# Patient Record
Sex: Female | Born: 1960 | Race: White | Hispanic: No | Marital: Married | Smoking: Never smoker
Health system: Southern US, Community
[De-identification: ages and names within clinical notes are randomized; demographics above are authoritative.]

## PROBLEM LIST (undated history)

## (undated) DIAGNOSIS — J329 Chronic sinusitis, unspecified: Secondary | ICD-10-CM

## (undated) DIAGNOSIS — T7840XA Allergy, unspecified, initial encounter: Secondary | ICD-10-CM

## (undated) DIAGNOSIS — N816 Rectocele: Secondary | ICD-10-CM

## (undated) DIAGNOSIS — K5900 Constipation, unspecified: Secondary | ICD-10-CM

## (undated) DIAGNOSIS — R32 Unspecified urinary incontinence: Secondary | ICD-10-CM

## (undated) DIAGNOSIS — K449 Diaphragmatic hernia without obstruction or gangrene: Secondary | ICD-10-CM

## (undated) DIAGNOSIS — K219 Gastro-esophageal reflux disease without esophagitis: Secondary | ICD-10-CM

## (undated) DIAGNOSIS — E669 Obesity, unspecified: Secondary | ICD-10-CM

## (undated) DIAGNOSIS — R42 Dizziness and giddiness: Principal | ICD-10-CM

## (undated) DIAGNOSIS — N3281 Overactive bladder: Secondary | ICD-10-CM

## (undated) DIAGNOSIS — J45909 Unspecified asthma, uncomplicated: Secondary | ICD-10-CM

## (undated) HISTORY — DX: Constipation, unspecified: K59.00

## (undated) HISTORY — PX: ABDOMINAL HYSTERECTOMY: SHX81

## (undated) HISTORY — DX: Chronic sinusitis, unspecified: J32.9

## (undated) HISTORY — DX: Unspecified asthma, uncomplicated: J45.909

## (undated) HISTORY — DX: Rectocele: N81.6

## (undated) HISTORY — DX: Unspecified urinary incontinence: R32

## (undated) HISTORY — DX: Allergy, unspecified, initial encounter: T78.40XA

## (undated) HISTORY — PX: KNEE SURGERY: SHX244

## (undated) HISTORY — DX: Diaphragmatic hernia without obstruction or gangrene: K44.9

## (undated) HISTORY — DX: Dizziness and giddiness: R42

## (undated) HISTORY — DX: Obesity, unspecified: E66.9

## (undated) HISTORY — DX: Overactive bladder: N32.81

## (undated) HISTORY — DX: Gastro-esophageal reflux disease without esophagitis: K21.9

---

## 2000-09-15 ENCOUNTER — Encounter: Payer: Self-pay | Admitting: Oral Surgery

## 2000-09-15 ENCOUNTER — Ambulatory Visit (HOSPITAL_COMMUNITY): Admission: RE | Admit: 2000-09-15 | Discharge: 2000-09-15 | Payer: Self-pay | Admitting: Oral Surgery

## 2005-06-07 ENCOUNTER — Ambulatory Visit (HOSPITAL_COMMUNITY): Admission: RE | Admit: 2005-06-07 | Discharge: 2005-06-07 | Payer: Self-pay | Admitting: Gastroenterology

## 2008-10-03 ENCOUNTER — Ambulatory Visit (HOSPITAL_BASED_OUTPATIENT_CLINIC_OR_DEPARTMENT_OTHER): Admission: RE | Admit: 2008-10-03 | Discharge: 2008-10-03 | Payer: Self-pay | Admitting: Internal Medicine

## 2008-10-03 ENCOUNTER — Ambulatory Visit: Payer: Self-pay | Admitting: Diagnostic Radiology

## 2009-05-11 ENCOUNTER — Ambulatory Visit: Payer: Self-pay | Admitting: Hematology & Oncology

## 2009-05-28 LAB — CBC WITH DIFFERENTIAL (CANCER CENTER ONLY)
BASO#: 0 10*3/uL (ref 0.0–0.2)
BASO%: 0.4 % (ref 0.0–2.0)
EOS%: 4.7 % (ref 0.0–7.0)
Eosinophils Absolute: 0.3 10*3/uL (ref 0.0–0.5)
HCT: 38.3 % (ref 34.8–46.6)
HGB: 12.6 g/dL (ref 11.6–15.9)
LYMPH#: 2.2 10*3/uL (ref 0.9–3.3)
LYMPH%: 30.9 % (ref 14.0–48.0)
MCH: 27.6 pg (ref 26.0–34.0)
MCHC: 32.8 g/dL (ref 32.0–36.0)
MCV: 84 fL (ref 81–101)
MONO#: 0.2 10*3/uL (ref 0.1–0.9)
MONO%: 3.5 % (ref 0.0–13.0)
NEUT#: 4.2 10*3/uL (ref 1.5–6.5)
NEUT%: 60.5 % (ref 39.6–80.0)
Platelets: 242 10*3/uL (ref 145–400)
RBC: 4.55 10*6/uL (ref 3.70–5.32)
RDW: 14.3 % (ref 10.5–14.6)
WBC: 6.9 10*3/uL (ref 3.9–10.0)

## 2009-05-28 LAB — MORPHOLOGY - CHCC SATELLITE
PLT EST ~~LOC~~: ADEQUATE
Platelet Morphology: NORMAL
RBC Comments: NORMAL

## 2009-10-16 ENCOUNTER — Ambulatory Visit (HOSPITAL_BASED_OUTPATIENT_CLINIC_OR_DEPARTMENT_OTHER): Admission: RE | Admit: 2009-10-16 | Discharge: 2009-10-16 | Payer: Self-pay | Admitting: Internal Medicine

## 2009-10-16 ENCOUNTER — Ambulatory Visit: Payer: Self-pay | Admitting: Diagnostic Radiology

## 2010-10-12 ENCOUNTER — Other Ambulatory Visit: Payer: Self-pay | Admitting: Family Medicine

## 2010-10-12 ENCOUNTER — Other Ambulatory Visit: Payer: Self-pay | Admitting: *Deleted

## 2010-10-12 DIAGNOSIS — Z1231 Encounter for screening mammogram for malignant neoplasm of breast: Secondary | ICD-10-CM

## 2010-10-26 ENCOUNTER — Ambulatory Visit (HOSPITAL_BASED_OUTPATIENT_CLINIC_OR_DEPARTMENT_OTHER)
Admission: RE | Admit: 2010-10-26 | Discharge: 2010-10-26 | Disposition: A | Discharge status: Home / Self Care | Payer: BC Managed Care – PPO | Source: Ambulatory Visit | Attending: Family Medicine | Admitting: Family Medicine

## 2010-10-26 DIAGNOSIS — Z1231 Encounter for screening mammogram for malignant neoplasm of breast: Secondary | ICD-10-CM

## 2012-06-26 ENCOUNTER — Ambulatory Visit: Payer: Self-pay | Admitting: General Practice

## 2012-11-13 ENCOUNTER — Ambulatory Visit: Payer: Self-pay | Admitting: General Practice

## 2013-03-22 ENCOUNTER — Encounter (INDEPENDENT_AMBULATORY_CARE_PROVIDER_SITE_OTHER): Payer: Self-pay

## 2013-03-22 ENCOUNTER — Ambulatory Visit (INDEPENDENT_AMBULATORY_CARE_PROVIDER_SITE_OTHER): Payer: Managed Care, Other (non HMO) | Admitting: Family Medicine

## 2013-03-22 ENCOUNTER — Ambulatory Visit (INDEPENDENT_AMBULATORY_CARE_PROVIDER_SITE_OTHER): Payer: Managed Care, Other (non HMO)

## 2013-03-22 ENCOUNTER — Encounter: Payer: Self-pay | Admitting: Family Medicine

## 2013-03-22 VITALS — BP 134/93 | HR 66 | Temp 97.1°F | Ht 65.0 in | Wt 223.4 lb

## 2013-03-22 DIAGNOSIS — R059 Cough, unspecified: Secondary | ICD-10-CM

## 2013-03-22 DIAGNOSIS — R062 Wheezing: Secondary | ICD-10-CM | POA: Insufficient documentation

## 2013-03-22 DIAGNOSIS — E669 Obesity, unspecified: Secondary | ICD-10-CM

## 2013-03-22 DIAGNOSIS — J45901 Unspecified asthma with (acute) exacerbation: Secondary | ICD-10-CM

## 2013-03-22 DIAGNOSIS — R05 Cough: Secondary | ICD-10-CM

## 2013-03-22 DIAGNOSIS — R1110 Vomiting, unspecified: Secondary | ICD-10-CM | POA: Insufficient documentation

## 2013-03-22 LAB — POCT CBC
Granulocyte percent: 57.1 %G (ref 37–80)
HCT, POC: 39.6 % (ref 37.7–47.9)
Hemoglobin: 12.3 g/dL (ref 12.2–16.2)
Lymph, poc: 2.3 (ref 0.6–3.4)
MCH, POC: 25 pg — AB (ref 27–31.2)
MCHC: 31.2 g/dL — AB (ref 31.8–35.4)
MCV: 80.2 fL (ref 80–97)
MPV: 8.1 fL (ref 0–99.8)
POC Granulocyte: 3.5 (ref 2–6.9)
POC LYMPH PERCENT: 37.8 %L (ref 10–50)
Platelet Count, POC: 248 10*3/uL (ref 142–424)
RBC: 4.9 M/uL (ref 4.04–5.48)
RDW, POC: 16.4 %
WBC: 6.1 10*3/uL (ref 4.6–10.2)

## 2013-03-22 MED ORDER — BUDESONIDE-FORMOTEROL FUMARATE 160-4.5 MCG/ACT IN AERO: 2.0000 | INHALATION_SPRAY | Freq: Two times a day (BID) | RESPIRATORY_TRACT | Status: DC
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
## 2013-03-22 NOTE — Patient Instructions (Addendum)
Dr Woodroe ModeFrancis Kiam Bransfield's Recommendations  For nutrition information, I recommend books:  1).Eat to Live by Dr Monico HoarJoel Fuhrman. 2).Prevent and Reverse Heart Disease by Dr Suzzette Righteraldwell Esselstyn. 3) Dr Katherina RightNeal Barnard's Book:  Program to Reverse Diabetes  Exercise recommendations are:  If unable to walk, then the patient can exercise in a chair 3 times a day. By flapping arms like a bird gently and raising legs outwards to the front.  If ambulatory, the patient can go for walks for 30 minutes 3 times a week. Then increase the intensity and duration as tolerated.  Goal is to try to attain exercise frequency to 5 times a week.  If applicable: Best to perform resistance exercises (machines or weights) 2 days a week and cardio type exercises 3 days per week.   Asthma, Adult Asthma is a recurring condition in which the airways tighten and narrow. Asthma can make it difficult to breathe. It can cause coughing, wheezing, and shortness of breath. Asthma episodes (also called asthma attacks) range from minor to life-threatening. Asthma cannot be cured, but medicines and lifestyle changes can help control it. CAUSES Asthma is believed to be caused by inherited (genetic) and environmental factors, but its exact cause is unknown. Asthma may be triggered by allergens, lung infections, or irritants in the air. Asthma triggers are different for each person. Common triggers include:   Animal dander.  Dust mites.  Cockroaches.  Pollen from trees or grass.  Mold.  Smoke.  Air pollutants such as dust, household cleaners, hair sprays, aerosol sprays, paint fumes, strong chemicals, or strong odors.  Cold air, weather changes, and winds (which increase molds and pollens in the air).  Strong emotional expressions such as crying or laughing hard.  Stress.  Certain medicines (such as aspirin) or types of drugs (such as beta-blockers).  Sulfites in foods and drinks. Foods and drinks that may contain sulfites  include dried fruit, potato chips, and sparkling grape juice.  Infections or inflammatory conditions such as the flu, a cold, or an inflammation of the nasal membranes (rhinitis).  Gastroesophageal reflux disease (GERD).  Exercise or strenuous activity. SYMPTOMS Symptoms may occur immediately after asthma is triggered or many hours later. Symptoms include:  Wheezing.  Excessive nighttime or early morning coughing.  Frequent or severe coughing with a common cold.  Chest tightness.  Shortness of breath. DIAGNOSIS  The diagnosis of asthma is made by a review of your medical history and a physical exam. Tests may also be performed. These may include:  Lung function studies. These tests show how much air you breath in and out.  Allergy tests.  Imaging tests such as X-rays. TREATMENT  Asthma cannot be cured, but it can usually be controlled. Treatment involves identifying and avoiding your asthma triggers. It also involves medicines. There are 2 classes of medicine used for asthma treatment:   Controller medicines. These prevent asthma symptoms from occurring. They are usually taken every day.  Reliever or rescue medicines. These quickly relieve asthma symptoms. They are used as needed and provide short-term relief. Your health care provider will help you create an asthma action plan. An asthma action plan is a written plan for managing and treating your asthma attacks. It includes a list of your asthma triggers and how they may be avoided. It also includes information on when medicines should be taken and when their dosage should be changed. An action plan may also involve the use of a device called a peak flow meter.  A peak flow meter measures how well the lungs are working. It helps you monitor your condition. HOME CARE INSTRUCTIONS   Take medicine as directed by your health care provider. Speak with your health care provider if you have questions about how or when to take the  medicines.  Use a peak flow meter as directed by your health care provider. Record and keep track of readings.  Understand and use the action plan to help minimize or stop an asthma attack without needing to seek medical care.  Control your home environment in the following ways to help prevent asthma attacks:  Do not smoke. Avoid being exposed to secondhand smoke.  Change your heating and air conditioning filter regularly.  Limit your use of fireplaces and wood stoves.  Get rid of pests (such as roaches and mice) and their droppings.  Throw away plants if you see mold on them.  Clean your floors and dust regularly. Use unscented cleaning products.  Try to have someone else vacuum for you regularly. Stay out of rooms while they are being vacuumed and for a short while afterward. If you vacuum, use a dust mask from a hardware store, a double-layered or microfilter vacuum cleaner bag, or a vacuum cleaner with a HEPA filter.  Replace carpet with wood, tile, or vinyl flooring. Carpet can trap dander and dust.  Use allergy-proof pillows, mattress covers, and box spring covers.  Wash bed sheets and blankets every week in hot water and dry them in a dryer.  Use blankets that are made of polyester or cotton.  Clean bathrooms and kitchens with bleach. If possible, have someone repaint the walls in these rooms with mold-resistant paint. Keep out of the rooms that are being cleaned and painted.  Wash hands frequently. SEEK MEDICAL CARE IF:   You have wheezing, shortness of breath, or a cough even if taking medicine to prevent attacks.  The colored mucus you cough up (sputum) is thicker than usual.  Your sputum changes from clear or white to yellow, green, gray, or bloody.  You have any problems that may be related to the medicines you are taking (such as a rash, itching, swelling, or trouble breathing).  You are using a reliever medicine more than 2 3 times per week.  Your peak flow  is still at 50 79% of you personal best after following your action plan for 1 hour. SEEK IMMEDIATE MEDICAL CARE IF:   You seem to be getting worse and are unresponsive to treatment during an asthma attack.  You are short of breath even at rest.  You get short of breath when doing very little physical activity.  You have difficulty eating, drinking, or talking due to asthma symptoms.  You develop chest pain.  You develop a fast heartbeat.  You have a bluish color to your lips or fingernails.  You are lightheaded, dizzy, or faint.  Your peak flow is less than 50% of your personal best.  You have a fever or persistent symptoms for more than 2 3 days.  You have a fever and symptoms suddenly get worse. MAKE SURE YOU:   Understand these instructions.  Will watch your condition.  Will get help right away if you are not doing well or get worse. Document Released: 02/07/2005 Document Revised: 10/10/2012 Document Reviewed: 09/06/2012 [facility] Patient Information 2014 [place], [place].

## 2013-03-22 NOTE — Progress Notes (Signed)
PT FELT FAINT IN LAB ASSISTANCE GIVEN PER KRISTIN H. VS RECK

## 2013-03-23 LAB — CMP14+EGFR
ALT: 25 IU/L (ref 0–32)
AST: 21 IU/L (ref 0–40)
Albumin/Globulin Ratio: 1.8 (ref 1.1–2.5)
Albumin: 4.6 g/dL (ref 3.5–5.5)
Alkaline Phosphatase: 128 IU/L — ABNORMAL HIGH (ref 39–117)
BUN/Creatinine Ratio: 13 (ref 9–23)
BUN: 10 mg/dL (ref 6–24)
CO2: 23 mmol/L (ref 18–29)
Calcium: 9.5 mg/dL (ref 8.7–10.2)
Chloride: 102 mmol/L (ref 97–108)
Creatinine, Ser: 0.76 mg/dL (ref 0.57–1.00)
GFR calc Af Amer: 105 mL/min/{1.73_m2} (ref >59)
GFR calc non Af Amer: 91 mL/min/{1.73_m2} (ref >59)
Globulin, Total: 2.6 g/dL (ref 1.5–4.5)
Glucose: 101 mg/dL — ABNORMAL HIGH (ref 65–99)
Potassium: 4 mmol/L (ref 3.5–5.2)
Sodium: 141 mmol/L (ref 134–144)
Total Bilirubin: 0.3 mg/dL (ref 0.0–1.2)
Total Protein: 7.2 g/dL (ref 6.0–8.5)

## 2013-03-23 NOTE — Progress Notes (Signed)
Quick Note:  Call Patient Labs that are abnormal: Chest Xray, the Hiatal hernia is larger.  Recommendations: Recommendation is a CT. Will discuss at the follow up visit.   ______

## 2013-03-23 NOTE — Progress Notes (Signed)
Quick Note:  Call patient. Labs normal. No change in plan. ______ 

## 2013-03-25 ENCOUNTER — Telehealth: Payer: Self-pay | Admitting: Family Medicine

## 2013-03-25 NOTE — Telephone Encounter (Signed)
Pt aware of test results

## 2013-04-09 ENCOUNTER — Ambulatory Visit: Payer: Managed Care, Other (non HMO) | Admitting: Family Medicine

## 2013-04-19 ENCOUNTER — Ambulatory Visit (INDEPENDENT_AMBULATORY_CARE_PROVIDER_SITE_OTHER): Payer: Managed Care, Other (non HMO) | Admitting: Family Medicine

## 2013-04-19 ENCOUNTER — Encounter: Payer: Self-pay | Admitting: Family Medicine

## 2013-04-19 VITALS — BP 125/88 | HR 96 | Temp 97.9°F | Ht 65.0 in | Wt 229.6 lb

## 2013-04-19 DIAGNOSIS — J45909 Unspecified asthma, uncomplicated: Secondary | ICD-10-CM | POA: Insufficient documentation

## 2013-04-19 DIAGNOSIS — R059 Cough, unspecified: Secondary | ICD-10-CM

## 2013-04-19 DIAGNOSIS — K449 Diaphragmatic hernia without obstruction or gangrene: Secondary | ICD-10-CM | POA: Insufficient documentation

## 2013-04-19 DIAGNOSIS — E669 Obesity, unspecified: Secondary | ICD-10-CM

## 2013-04-19 DIAGNOSIS — K219 Gastro-esophageal reflux disease without esophagitis: Secondary | ICD-10-CM

## 2013-04-19 DIAGNOSIS — R05 Cough: Secondary | ICD-10-CM

## 2013-04-19 MED ORDER — PANTOPRAZOLE SODIUM 40 MG PO TBEC: 40.0000 mg | DELAYED_RELEASE_TABLET | Freq: Every day | ORAL | Status: DC

## 2013-04-19 NOTE — Progress Notes (Signed)
Patient ID: Christina [name], female   DOB: November 11, 1960, 53 y.o.   MRN: [id number] SUBJECTIVE: CC: Chief Complaint  Patient presents with  . Follow-up    reck cough states still has cough and notices it more at meals     HPI:  As above. Has choking spells with foods. used to see Dr Janan Halter- GI in [place]. Breathing is a little better. Using the inhaler. She was supposed to be on a PPI but went OTC, has had a HH for years. Not doing GERD precautions.   Past Medical History  Diagnosis Date  . Hiatal hernia   . Asthma   . GERD (gastroesophageal reflux disease)    No past surgical history on file. History   Social History  . Marital Status: Married    Spouse Name: N/A    Number of Children: N/A  . Years of Education: N/A   Occupational History  . Not on file.   Social History Main Topics  . Smoking status: Never Smoker   . Smokeless tobacco: Not on file  . Alcohol Use: Not on file  . Drug Use: Not on file  . Sexual Activity: Not on file   Other Topics Concern  . Not on file   Social History Narrative  . No narrative on file   No family history on file. Current Outpatient Prescriptions on File Prior to Visit  Medication Sig Dispense Refill  . budesonide-formoterol (SYMBICORT) 160-4.5 MCG/ACT inhaler Inhale 2 puffs into the lungs 2 (two) times daily.  1 Inhaler  3   No current facility-administered medications on file prior to visit.   Allergies  Allergen Reactions  . Ivp Dye [Iodinated Diagnostic Agents]     STOP BREATHING  . Shellfish Allergy     There is no immunization history on file for this patient. Prior to Admission medications   Medication Sig Start Date End Date Taking? Authorizing Provider  budesonide-formoterol (SYMBICORT) 160-4.5 MCG/ACT inhaler Inhale 2 puffs into the lungs 2 (two) times daily. 03/22/13  Yes Vernie Shanks, MD     ROS: As above in the HPI. All other systems are stable or negative.  OBJECTIVE: APPEARANCE:  Patient in no acute  distress.The patient appeared well nourished and normally developed. Acyanotic. Waist: VITAL SIGNS:BP 125/88  Pulse 96  Temp(Src) 97.9 F (36.6 C) (Oral)  Ht 5' 5"  (1.651 m)  Wt 229 lb 9.6 oz (104.146 kg)  BMI 38.21 kg/m2  SpO2 97% OBese WF  SKIN: warm and  Dry without overt rashes, tattoos and scars  HEAD and Neck: without JVD, Head and scalp: normal Eyes:No scleral icterus. Fundi normal, eye movements normal. Ears: Auricle normal, canal normal, Tympanic membranes normal, insufflation normal. Nose: normal Throat: normal Neck & thyroid: normal  CHEST & LUNGS: Chest wall: normal Lungs: Clear  CVS: Reveals the PMI to be normally located. Regular rhythm, First and Second Heart sounds are normal,  absence of murmurs, rubs or gallops. Peripheral vasculature: Radial pulses: normal Dorsal pedis pulses: normal Posterior pulses: normal  ABDOMEN:  Appearance: Obese Benign, no organomegaly, no masses, no Abdominal Aortic enlargement. No Guarding , no rebound. No Bruits. Bowel sounds: normal  RECTAL: N/A GU: N/A  EXTREMETIES: nonedematous.  MUSCULOSKELETAL:  Spine: normal Joints: intact  NEUROLOGIC: oriented to time,place and person; nonfocal. Strength is normal Sensory is normal Reflexes are normal Cranial Nerves are normal.  Results for orders placed in visit on 03/22/13  CMP14+EGFR      Result Value Ref Range   Glucose  101 (*) 65 - 99 mg/dL   BUN 10  6 - 24 mg/dL   Creatinine, Ser 0.76  0.57 - 1.00 mg/dL   GFR calc non Af Amer 91  >59 mL/min/1.73   GFR calc Af Amer 105  >59 mL/min/1.73   BUN/Creatinine Ratio 13  9 - 23   Sodium 141  134 - 144 mmol/L   Potassium 4.0  3.5 - 5.2 mmol/L   Chloride 102  97 - 108 mmol/L   CO2 23  18 - 29 mmol/L   Calcium 9.5  8.7 - 10.2 mg/dL   Total Protein 7.2  6.0 - 8.5 g/dL   Albumin 4.6  3.5 - 5.5 g/dL   Globulin, Total 2.6  1.5 - 4.5 g/dL   Albumin/Globulin Ratio 1.8  1.1 - 2.5   Total Bilirubin 0.3  0.0 - 1.2 mg/dL    Alkaline Phosphatase 128 (*) 39 - 117 IU/L   AST 21  0 - 40 IU/L   ALT 25  0 - 32 IU/L  POCT CBC      Result Value Ref Range   WBC 6.1  4.6 - 10.2 K/uL   Lymph, poc 2.3  0.6 - 3.4   POC LYMPH PERCENT 37.8  10 - 50 %L   POC Granulocyte 3.5  2 - 6.9   Granulocyte percent 57.1  37 - 80 %G   RBC 4.9  4.04 - 5.48 M/uL   Hemoglobin 12.3  12.2 - 16.2 g/dL   HCT, POC 39.6  37.7 - 47.9 %   MCV 80.2  80 - 97 fL   MCH, POC 25.0 (*) 27 - 31.2 pg   MCHC 31.2 (*) 31.8 - 35.4 g/dL   RDW, POC 16.4     Platelet Count, POC 248.0  142 - 424 K/uL   MPV 8.1  0 - 99.8 fL   PFTs: normal Scanned in the records.  ASSESSMENT:  Asthma  Cough  Hiatal hernia - Plan: pantoprazole (PROTONIX) 40 MG tablet  Obesity  GERD (gastroesophageal reflux disease) - Plan: pantoprazole (PROTONIX) 40 MG tablet Larger Hiatal hernia shadow on the Chest Xray.   PLAN: Handout on GERD. Discussed GERD precautions.       Dr Paula Libra Recommendations  For nutrition information, I recommend books:  1).Eat to Live by Dr Excell Seltzer. 2).Prevent and Reverse Heart Disease by Dr Karl Luke. 3) Dr Janene Harvey Book:  Program to Reverse Diabetes  Exercise recommendations are:  If unable to walk, then the patient can exercise in a chair 3 times a day. By flapping arms like a bird gently and raising legs outwards to the front.  If ambulatory, the patient can go for walks for 30 minutes 3 times a week. Then increase the intensity and duration as tolerated.  Goal is to try to attain exercise frequency to 5 times a week.  If applicable: Best to perform resistance exercises (machines or weights) 2 days a week and cardio type exercises 3 days per week.   Patient to see Dr Janan Halter. Call if she needs a forma referral.  Discussed with her the Xray finding of possible enlarging Hiatal hernia, and the need for further evaluation.  No orders of the defined types were placed in this encounter.   Meds ordered this  encounter  Medications  . pantoprazole (PROTONIX) 40 MG tablet    Sig: Take 1 tablet (40 mg total) by mouth daily.    Dispense:  30 tablet    Refill:  3   There are no discontinued medications. Return in about 4 weeks (around 05/17/2013) for Recheck medical problems.  Ragan Duhon P. Jacelyn Grip, M.D.

## 2013-04-19 NOTE — Patient Instructions (Addendum)
Gastroesophageal Reflux Disease, Adult Gastroesophageal reflux disease (GERD) happens when acid from your stomach flows up into the esophagus. When acid comes in contact with the esophagus, the acid causes soreness (inflammation) in the esophagus. Over time, GERD may create small holes (ulcers) in the lining of the esophagus. CAUSES   Increased body weight. This puts pressure on the stomach, making acid rise from the stomach into the esophagus.  Smoking. This increases acid production in the stomach.  Drinking alcohol. This causes decreased pressure in the lower esophageal sphincter (valve or ring of muscle between the esophagus and stomach), allowing acid from the stomach into the esophagus.  Late evening meals and a full stomach. This increases pressure and acid production in the stomach.  A malformed lower esophageal sphincter. Sometimes, no cause is found. SYMPTOMS   Burning pain in the lower part of the mid-chest behind the breastbone and in the mid-stomach area. This may occur twice a week or more often.  Trouble swallowing.  Sore throat.  Dry cough.  Asthma-like symptoms including chest tightness, shortness of breath, or wheezing. DIAGNOSIS  Your caregiver may be able to diagnose GERD based on your symptoms. In some cases, X-rays and other tests may be done to check for complications or to check the condition of your stomach and esophagus. TREATMENT  Your caregiver may recommend over-the-counter or prescription medicines to help decrease acid production. Ask your caregiver before starting or adding any new medicines.  HOME CARE INSTRUCTIONS   Change the factors that you can control. Ask your caregiver for guidance concerning weight loss, quitting smoking, and alcohol consumption.  Avoid foods and drinks that make your symptoms worse, such as:  Caffeine or alcoholic drinks.  Chocolate.  Peppermint or mint flavorings.  Garlic and onions.  Spicy foods.  Citrus fruits,  such as oranges, lemons, or limes.  Tomato-based foods such as sauce, chili, salsa, and pizza.  Fried and fatty foods.  Avoid lying down for the 3 hours prior to your bedtime or prior to taking a nap.  Eat small, frequent meals instead of large meals.  Wear loose-fitting clothing. Do not wear anything tight around your waist that causes pressure on your stomach.  Raise the head of your bed 6 to 8 inches with wood blocks to help you sleep. Extra pillows will not help.  Only take over-the-counter or prescription medicines for pain, discomfort, or fever as directed by your caregiver.  Do not take aspirin, ibuprofen, or other nonsteroidal anti-inflammatory drugs (NSAIDs). SEEK IMMEDIATE MEDICAL CARE IF:   You have pain in your arms, neck, jaw, teeth, or back.  Your pain increases or changes in intensity or duration.  You develop nausea, vomiting, or sweating (diaphoresis).  You develop shortness of breath, or you faint.  Your vomit is green, yellow, black, or looks like coffee grounds or blood.  Your stool is red, bloody, or black. These symptoms could be signs of other problems, such as heart disease, gastric bleeding, or esophageal bleeding. MAKE SURE YOU:   Understand these instructions.  Will watch your condition.  Will get help right away if you are not doing well or get worse. Document Released: 11/17/2004 Document Revised: 05/02/2011 Document Reviewed: 08/27/2010 ExitCare Patient Information 2014 [employer].        Dr Kylena Mole's Recommendations  For nutrition information, I recommend books:  1).Eat to Live by Dr Joel Fuhrman. 2).Prevent and Reverse Heart Disease by Dr Caldwell Esselstyn. 3) Dr Neal Barnard's Book:  Program to Reverse Diabetes    Exercise recommendations are:  If unable to walk, then the patient can exercise in a chair 3 times a day. By flapping arms like a bird gently and raising legs outwards to the front.  If ambulatory, the patient  can go for walks for 30 minutes 3 times a week. Then increase the intensity and duration as tolerated.  Goal is to try to attain exercise frequency to 5 times a week.  If applicable: Best to perform resistance exercises (machines or weights) 2 days a week and cardio type exercises 3 days per week.   Hiatal Hernia A hiatal hernia occurs when a part of the stomach slides above the diaphragm. The diaphragm is the thin muscle separating the belly (abdomen) from the chest. A hiatal hernia can be something you are born with or develop over time. Hiatal hernias may allow stomach acid to flow back into your esophagus, the tube which carries food from your mouth to your stomach. If this acid causes problems it is called GERD (gastro-esophageal reflux disease).  SYMPTOMS  Common symptoms of GERD are heartburn (burning in your chest). This is worse when lying down or bending over. It may also cause belching and indigestion. Some of the things which make GERD worse are:  Increased weight pushes on stomach making acid rise more easily.  Smoking markedly increases acid production.  Alcohol decreases lower esophageal sphincter pressure (valve between stomach and esophagus), allowing acid from stomach into esophagus.  Late evening meals and going to bed with a full stomach increases pressure.  Anything that causes an increase in acid production.  Lower esophageal sphincter incompetence. DIAGNOSIS  Hiatal hernia is often diagnosed with x-rays of your stomach and small bowel. This is called an UGI (upper gastrointestinal x-ray). Sometimes a gastroscopic procedure is done. This is a procedure where your caregiver uses a flexible instrument to look into the stomach and small bowel. HOME CARE INSTRUCTIONS   Try to achieve and maintain an ideal body weight.  Avoid drinking alcoholic beverages.  Stop smoking.  Put the head of your bed on 4 to 6 inch blocks. This will keep your head and esophagus higher  than your stomach. If you cannot use blocks, sleep with several pillows under your head and shoulders.  Over-the-counter medications will decrease acid production. Your caregiver can also prescribe medications for this. Take as directed.  1/2 to 1 teaspoon of an antacid taken every hour while awake, with meals and at bedtime, will neutralize acid.  Do not take aspirin, ibuprofen (Advil or Motrin), or other nonsteroidal anti-inflammatory drugs.  Do not wear tight clothing around your chest or stomach.  Eat smaller meals and eat more frequently. This keeps your stomach from getting too full. Eat slowly.  Do not lie down for 2 or 3 hours after eating. Do not eat or drink anything 1 to 2 hours before going to bed.  Avoid caffeine beverages (colas, coffee, cocoa, tea), fatty foods, citrus fruits and all other foods and drinks that contain acid and that seem to increase the problems.  Avoid bending over, especially after eating. Also avoid straining during bowel movements or when urinating or lifting things. Anything that increases the pressure in your belly increases the amount of acid that may be pushed up into your esophagus. SEEK IMMEDIATE MEDICAL CARE IF:  There is change in location (pain in arms, neck, jaw, teeth or back) of your pain, or the pain is getting worse.  You also experience nausea, vomiting, sweating (diaphoresis), or shortness  of breath.  You develop continual vomiting, vomit blood or coffee ground material, have bright red blood in your stools, or have black tarry stools. Some of these symptoms could signal other problems such as heart disease. MAKE SURE YOU:   Understand these instructions.  Monitor your condition.  Contact your caregiver if you are not doing well or are getting worse. Document Released: 04/30/2003 Document Revised: 05/02/2011 Document Reviewed: 02/07/2005 [facility] Patient Information 2014 [place], [place].

## 2013-05-20 ENCOUNTER — Ambulatory Visit (INDEPENDENT_AMBULATORY_CARE_PROVIDER_SITE_OTHER): Payer: Managed Care, Other (non HMO) | Admitting: Family Medicine

## 2013-05-20 ENCOUNTER — Encounter: Payer: Self-pay | Admitting: Family Medicine

## 2013-05-20 ENCOUNTER — Ambulatory Visit (INDEPENDENT_AMBULATORY_CARE_PROVIDER_SITE_OTHER): Payer: Managed Care, Other (non HMO)

## 2013-05-20 VITALS — BP 119/74 | HR 73 | Temp 97.8°F | Ht 65.0 in | Wt 233.8 lb

## 2013-05-20 DIAGNOSIS — K219 Gastro-esophageal reflux disease without esophagitis: Secondary | ICD-10-CM

## 2013-05-20 DIAGNOSIS — M25539 Pain in unspecified wrist: Secondary | ICD-10-CM

## 2013-05-20 DIAGNOSIS — J45909 Unspecified asthma, uncomplicated: Secondary | ICD-10-CM

## 2013-05-20 DIAGNOSIS — R059 Cough, unspecified: Secondary | ICD-10-CM

## 2013-05-20 DIAGNOSIS — M25531 Pain in right wrist: Secondary | ICD-10-CM

## 2013-05-20 DIAGNOSIS — R05 Cough: Secondary | ICD-10-CM

## 2013-05-20 DIAGNOSIS — E669 Obesity, unspecified: Secondary | ICD-10-CM

## 2013-05-20 DIAGNOSIS — K449 Diaphragmatic hernia without obstruction or gangrene: Secondary | ICD-10-CM

## 2013-05-20 MED ORDER — DICLOFENAC SODIUM 1 % TD GEL: 2.0000 g | Freq: Four times a day (QID) | TRANSDERMAL | Status: AC

## 2013-05-20 NOTE — Progress Notes (Signed)
Patient ID: Christina [name], female   DOB: [birth year], 53 y.o.   MRN: [id number] SUBJECTIVE: CC: Chief Complaint  Patient presents with  . Follow-up    reck cough needs referral to Dr Janan Halter states good days and bad     HPI: Here for follow up of [place] and GERD and Asthma. Cough comes and goes. Has been in [facility] in [place]. Beds are flat. Having symptoms then.  Breakfast: 1/2 cup of oatmeal. Lunch: 3 oz of chicken and slaw. Supper : ate at [place]  Right latter wrist tender and sore to use for the past 1  1/2 months. No fever, no swelling.  Past Medical History  Diagnosis Date  . Hiatal hernia   . Asthma   . GERD (gastroesophageal reflux disease)    No past surgical history on file. History   Social History  . Marital Status: Married    Spouse Name: N/A    Number of Children: N/A  . Years of Education: N/A   Occupational History  . Not on file.   Social History Main Topics  . Smoking status: Never Smoker   . Smokeless tobacco: Not on file  . Alcohol Use: Not on file  . Drug Use: Not on file  . Sexual Activity: Not on file   Other Topics Concern  . Not on file   Social History Narrative  . No narrative on file   No family history on file. Current Outpatient Prescriptions on File Prior to Visit  Medication Sig Dispense Refill  . budesonide-formoterol (SYMBICORT) 160-4.5 MCG/ACT inhaler Inhale 2 puffs into the lungs 2 (two) times daily.  1 Inhaler  3  . pantoprazole (PROTONIX) 40 MG tablet Take 1 tablet (40 mg total) by mouth daily.  30 tablet  3   No current facility-administered medications on file prior to visit.   Allergies  Allergen Reactions  . Ivp Dye [Iodinated Diagnostic Agents]     STOP BREATHING  . Shellfish Allergy     There is no immunization history on file for this patient. Prior to Admission medications   Medication Sig Start Date End Date Taking? Authorizing Provider  budesonide-formoterol (SYMBICORT) 160-4.5 MCG/ACT inhaler Inhale 2  puffs into the lungs 2 (two) times daily. 03/22/13  Yes Vernie Shanks, MD  pantoprazole (PROTONIX) 40 MG tablet Take 1 tablet (40 mg total) by mouth daily. 04/19/13  Yes Vernie Shanks, MD     ROS: As above in the HPI. All other systems are stable or negative.  OBJECTIVE: APPEARANCE:  Patient in no acute distress.The patient appeared well nourished and normally developed. Acyanotic. Waist: VITAL SIGNS:BP 119/74  Pulse 73  Temp(Src) 97.8 F (36.6 C) (Oral)  Ht 5' 5"  (1.651 m)  Wt 233 lb 12.8 oz (106.051 kg)  BMI 38.91 kg/m2  SpO2 96%  Obese WF Weight gain.   SKIN: warm and  Dry without overt rashes, tattoos and scars  HEAD and Neck: without JVD, Head and scalp: normal Eyes:No scleral icterus. Fundi normal, eye movements normal. Ears: Auricle normal, canal normal, Tympanic membranes normal, insufflation normal. Nose: normal Throat: normal Neck & thyroid: normal  CHEST & LUNGS: Chest wall: normal Lungs: Clear  CVS: Reveals the PMI to be normally located. Regular rhythm, First and Second Heart sounds are normal,  absence of murmurs, rubs or gallops. Peripheral vasculature: Radial pulses: normal Dorsal pedis pulses: normal Posterior pulses: normal  ABDOMEN:  Appearance: obese Benign, no organomegaly, no masses, no Abdominal Aortic enlargement. No Guarding ,  no rebound. No Bruits. Bowel sounds: normal  RECTAL: N/A GU: N/A  EXTREMETIES: nonedematous.  MUSCULOSKELETAL:  Spine: normal Joints: right wrist lateral wrist margin tender to palpation and stressing the  Ligaments. No swelling. No effucion. No deformity.   NEUROLOGIC: oriented to time,place and person; nonfocal. Cranial Nerves are normal.  Results for orders placed in visit on 03/22/13  CMP14+EGFR      Result Value Ref Range   Glucose 101 (*) 65 - 99 mg/dL   BUN 10  6 - 24 mg/dL   Creatinine, Ser 0.76  0.57 - 1.00 mg/dL   GFR calc non Af Amer 91  >59 mL/min/1.73   GFR calc Af Amer 105  >59  mL/min/1.73   BUN/Creatinine Ratio 13  9 - 23   Sodium 141  134 - 144 mmol/L   Potassium 4.0  3.5 - 5.2 mmol/L   Chloride 102  97 - 108 mmol/L   CO2 23  18 - 29 mmol/L   Calcium 9.5  8.7 - 10.2 mg/dL   Total Protein 7.2  6.0 - 8.5 g/dL   Albumin 4.6  3.5 - 5.5 g/dL   Globulin, Total 2.6  1.5 - 4.5 g/dL   Albumin/Globulin Ratio 1.8  1.1 - 2.5   Total Bilirubin 0.3  0.0 - 1.2 mg/dL   Alkaline Phosphatase 128 (*) 39 - 117 IU/L   AST 21  0 - 40 IU/L   ALT 25  0 - 32 IU/L  POCT CBC      Result Value Ref Range   WBC 6.1  4.6 - 10.2 K/uL   Lymph, poc 2.3  0.6 - 3.4   POC LYMPH PERCENT 37.8  10 - 50 %L   POC Granulocyte 3.5  2 - 6.9   Granulocyte percent 57.1  37 - 80 %G   RBC 4.9  4.04 - 5.48 M/uL   Hemoglobin 12.3  12.2 - 16.2 g/dL   HCT, POC 39.6  37.7 - 47.9 %   MCV 80.2  80 - 97 fL   MCH, POC 25.0 (*) 27 - 31.2 pg   MCHC 31.2 (*) 31.8 - 35.4 g/dL   RDW, POC 16.4     Platelet Count, POC 248.0  142 - 424 K/uL   MPV 8.1  0 - 99.8 fL    ASSESSMENT:  Obesity  Hiatal hernia - Plan: Ambulatory referral to Gastroenterology  Cough  Asthma  GERD (gastroesophageal reflux disease) - Plan: Ambulatory referral to Gastroenterology  Wrist pain, right - Plan: DG Wrist Complete Right, diclofenac sodium (VOLTAREN) 1 % GEL Has an enlarging Hiatal hernia. And I suspect that her symptoms are due to reflux as a consequence of the [facility].  PLAN:  Orders Placed This Encounter  Procedures  . DG Wrist Complete Right    Standing Status: Future     Number of Occurrences: 1     Standing Expiration Date: 07/21/2014    Order Specific Question:  Reason for Exam (SYMPTOM  OR DIAGNOSIS REQUIRED)    Answer:  right wrist pain    Order Specific Question:  Is the patient pregnant?    Answer:  No    Order Specific Question:  Preferred imaging location?    Answer:  Internal  . Ambulatory referral to Gastroenterology    Referral Priority:  Routine    Referral Type:  Consultation    Referral Reason:   Specialty Services Required    Requested Specialty:  Gastroenterology    Number of Visits Requested:  1  [facility] reading (PRIMARY) by  Dr.Jazman Reuter: no fracture no acute abnormality seen.                               Meds ordered this encounter  Medications  . diclofenac sodium (VOLTAREN) 1 % GEL    Sig: Apply 2 g topically 4 (four) times daily.    Dispense:  100 g    Refill:  1  will treat with strengthening exercises  To the wrist , ice, and topical voltaren.  There are no discontinued medications. Return in about 3 months (around 08/20/2013) for Recheck medical problems.  Pleasant Bensinger P. Jacelyn Grip, M.D.

## 2013-05-20 NOTE — Patient Instructions (Addendum)
Gastroesophageal Reflux Disease, Adult Gastroesophageal reflux disease (GERD) happens when acid from your stomach flows up into the esophagus. When acid comes in contact with the esophagus, the acid causes soreness (inflammation) in the esophagus. Over time, GERD may create small holes (ulcers) in the lining of the esophagus. CAUSES   Increased body weight. This puts pressure on the stomach, making acid rise from the stomach into the esophagus.  Smoking. This increases acid production in the stomach.  Drinking alcohol. This causes decreased pressure in the lower esophageal sphincter (valve or ring of muscle between the esophagus and stomach), allowing acid from the stomach into the esophagus.  Late evening meals and a full stomach. This increases pressure and acid production in the stomach.  A malformed lower esophageal sphincter. Sometimes, no cause is found. SYMPTOMS   Burning pain in the lower part of the mid-chest behind the breastbone and in the mid-stomach area. This may occur twice a week or more often.  Trouble swallowing.  Sore throat.  Dry cough.  Asthma-like symptoms including chest tightness, shortness of breath, or wheezing. DIAGNOSIS  Your caregiver may be able to diagnose GERD based on your symptoms. In some cases, X-rays and other tests may be done to check for complications or to check the condition of your stomach and esophagus. TREATMENT  Your caregiver may recommend over-the-counter or prescription medicines to help decrease acid production. Ask your caregiver before starting or adding any new medicines.  HOME CARE INSTRUCTIONS   Change the factors that you can control. Ask your caregiver for guidance concerning weight loss, quitting smoking, and alcohol consumption.  Avoid foods and drinks that make your symptoms worse, such as:  Caffeine or alcoholic drinks.  Chocolate.  Peppermint or mint flavorings.  Garlic and onions.  Spicy foods.  Citrus fruits,  such as oranges, lemons, or limes.  Tomato-based foods such as sauce, chili, salsa, and pizza.  Fried and fatty foods.  Avoid lying down for the 3 hours prior to your bedtime or prior to taking a nap.  Eat small, frequent meals instead of large meals.  Wear loose-fitting clothing. Do not wear anything tight around your waist that causes pressure on your stomach.  Raise the head of your bed 6 to 8 inches with wood blocks to help you sleep. Extra pillows will not help.  Only take over-the-counter or prescription medicines for pain, discomfort, or fever as directed by your caregiver.  Do not take aspirin, ibuprofen, or other nonsteroidal anti-inflammatory drugs (NSAIDs). SEEK IMMEDIATE MEDICAL CARE IF:   You have pain in your arms, neck, jaw, teeth, or back.  Your pain increases or changes in intensity or duration.  You develop nausea, vomiting, or sweating (diaphoresis).  You develop shortness of breath, or you faint.  Your vomit is green, yellow, black, or looks like coffee grounds or blood.  Your stool is red, bloody, or black. These symptoms could be signs of other problems, such as heart disease, gastric bleeding, or esophageal bleeding. MAKE SURE YOU:   Understand these instructions.  Will watch your condition.  Will get help right away if you are not doing well or get worse. Document Released: 11/17/2004 Document Revised: 05/02/2011 Document Reviewed: 08/27/2010 ExitCare Patient Information 2014 [employer].        Dr Lacresia Darwish's Recommendations  For nutrition information, I recommend books:  1).Eat to Live by Dr Joel Fuhrman. 2).Prevent and Reverse Heart Disease by Dr Caldwell Esselstyn. 3) Dr Neal Barnard's Book:  Program to Reverse Diabetes    Exercise recommendations are:  If unable to walk, then the patient can exercise in a chair 3 times a day. By flapping arms like a bird gently and raising legs outwards to the front.  If ambulatory, the patient  can go for walks for 30 minutes 3 times a week. Then increase the intensity and duration as tolerated.  Goal is to try to attain exercise frequency to 5 times a week.  If applicable: Best to perform resistance exercises (machines or weights) 2 days a week and cardio type exercises 3 days per week.  

## 2013-05-22 ENCOUNTER — Other Ambulatory Visit: Payer: Self-pay | Admitting: Family Medicine

## 2013-10-01 ENCOUNTER — Other Ambulatory Visit: Payer: Self-pay | Admitting: Family Medicine

## 2013-10-01 DIAGNOSIS — Z1231 Encounter for screening mammogram for malignant neoplasm of breast: Secondary | ICD-10-CM

## 2013-10-07 ENCOUNTER — Ambulatory Visit (HOSPITAL_BASED_OUTPATIENT_CLINIC_OR_DEPARTMENT_OTHER)
Admission: RE | Admit: 2013-10-07 | Discharge: 2013-10-07 | Disposition: A | Discharge status: Home / Self Care | Payer: 59 | Source: Ambulatory Visit | Attending: Family Medicine | Admitting: Family Medicine

## 2013-10-07 DIAGNOSIS — Z1231 Encounter for screening mammogram for malignant neoplasm of breast: Secondary | ICD-10-CM | POA: Diagnosis present

## 2013-10-17 ENCOUNTER — Encounter: Payer: Self-pay | Admitting: Adult Health

## 2013-10-17 ENCOUNTER — Ambulatory Visit (INDEPENDENT_AMBULATORY_CARE_PROVIDER_SITE_OTHER): Payer: 59 | Admitting: Adult Health

## 2013-10-17 VITALS — BP 114/80 | HR 80 | Ht 63.5 in | Wt 224.0 lb

## 2013-10-17 DIAGNOSIS — N3946 Mixed incontinence: Secondary | ICD-10-CM

## 2013-10-17 DIAGNOSIS — N3281 Overactive bladder: Secondary | ICD-10-CM

## 2013-10-17 DIAGNOSIS — R32 Unspecified urinary incontinence: Secondary | ICD-10-CM | POA: Insufficient documentation

## 2013-10-17 DIAGNOSIS — Z01419 Encounter for gynecological examination (general) (routine) without abnormal findings: Secondary | ICD-10-CM

## 2013-10-17 DIAGNOSIS — Z1212 Encounter for screening for malignant neoplasm of rectum: Secondary | ICD-10-CM

## 2013-10-17 DIAGNOSIS — N816 Rectocele: Secondary | ICD-10-CM

## 2013-10-17 DIAGNOSIS — K5900 Constipation, unspecified: Secondary | ICD-10-CM

## 2013-10-17 HISTORY — DX: Unspecified urinary incontinence: R32

## 2013-10-17 HISTORY — DX: Constipation, unspecified: K59.00

## 2013-10-17 HISTORY — DX: Rectocele: N81.6

## 2013-10-17 HISTORY — DX: Overactive bladder: N32.81

## 2013-10-17 LAB — HEMOCCULT GUIAC POC 1CARD (OFFICE): Fecal Occult Blood, POC: NEGATIVE

## 2013-10-17 MED ORDER — MIRABEGRON ER 25 MG PO TB24: 25.0000 mg | ORAL_TABLET | Freq: Every day | ORAL | Status: DC

## 2013-10-17 NOTE — Patient Instructions (Signed)
Urinary Incontinence Urinary incontinence is the involuntary loss of urine from your bladder. CAUSES  There are many causes of urinary incontinence. They include:  Medicines.  Infections.  Prostatic enlargement, leading to overflow of urine from your bladder.  Surgery.  Neurological diseases.  Emotional factors. SIGNS AND SYMPTOMS Urinary Incontinence can be divided into four types: 1. Urge incontinence. Urge incontinence is the involuntary loss of urine before you have the opportunity to go to the bathroom. There is a sudden urge to void but not enough time to reach a bathroom. 2. Stress incontinence. Stress incontinence is the sudden loss of urine with any activity that forces urine to pass. It is commonly caused by anatomical changes to the pelvis and sphincter areas of your body. 3. Overflow incontinence. Overflow incontinence is the loss of urine from an obstructed opening to your bladder. This results in a backup of urine and a resultant buildup of pressure within the bladder. When the pressure within the bladder exceeds the closing pressure of the sphincter, the urine overflows, which causes incontinence, similar to water overflowing a dam. 4. Total incontinence. Total incontinence is the loss of urine as a result of the inability to store urine within your bladder. DIAGNOSIS  Evaluating the cause of incontinence may require:  A thorough and complete medical and obstetric history.  A complete physical exam.  Laboratory tests such as a urine culture and sensitivities. When additional tests are indicated, they can include:  An ultrasound exam.  Kidney and bladder X-rays.  Cystoscopy. This is an exam of the bladder using a narrow scope.  Urodynamic testing to test the nerve function to the bladder and sphincter areas. TREATMENT  Treatment for urinary incontinence depends on the cause:  For urge incontinence caused by a bacterial infection, antibiotics will be prescribed.  If the urge incontinence is related to medicines you take, your health care provider may have you change the medicine.  For stress incontinence, surgery to re-establish anatomical support to the bladder or sphincter, or both, will often correct the condition.  For overflow incontinence caused by an enlarged prostate, an operation to open the channel through the enlarged prostate will allow the flow of urine out of the bladder. In women with fibroids, a hysterectomy may be recommended.  For total incontinence, surgery on your urinary sphincter may help. An artificial urinary sphincter (an inflatable cuff placed around the urethra) may be required. In women who have developed a hole-like passage between their bladder and vagina (vesicovaginal fistula), surgery to close the fistula often is required. HOME CARE INSTRUCTIONS  Normal daily hygiene and the use of pads or adult diapers that are changed regularly will help prevent odors and skin damage.  Avoid caffeine. It can overstimulate your bladder.  Use the bathroom regularly. Try about every 2-3 hours to go to the bathroom, even if you do not feel the need to do so. Take time to empty your bladder completely. After urinating, wait a minute. Then try to urinate again.  For causes involving nerve dysfunction, keep a log of the medicines you take and a journal of the times you go to the bathroom. SEEK MEDICAL CARE IF:  You experience worsening of pain instead of improvement in pain after your procedure.  Your incontinence becomes worse instead of better. SEE IMMEDIATE MEDICAL CARE IF:  You experience fever or shaking chills.  You are unable to pass your urine.  You have redness spreading into your groin or down into your thighs. MAKE SURE   YOU:   Understand these instructions.   Will watch your condition.  Will get help right away if you are not doing well or get worse. Document Released: 03/17/2004 Document Revised: 11/28/2012 Document  Reviewed: 07/17/2012 [facility] Patient Information 2015 [place], [place]. This information is not intended to replace advice given to you by your health care provider. Make sure you discuss any questions you have with your health care provider. Physical in 1 year Mammogram yearly Labs with PCP Call me 1/2 real oatmeal 1/2 cup apple sauce 1/2 cup prune juice daily

## 2013-10-17 NOTE — Progress Notes (Signed)
Patient ID: Aniaya Bacha, female   DOB: 08/04/1960, 53 y.o.   MRN: [id number] History of Present Illness: Grover is a 52 year old white female, married in for a physical.She is new to this practice.She works Holiday representative.  Current Medications, Allergies, Past Medical History, Past Surgical History, Family History and Social History were reviewed in [employer] record.     Review of Systems: Patient denies any headaches, blurred vision, shortness of breath, chest pain, abdominal pain, problems with  intercourse. No joint pain or mood swings.She does have constipation, may go once a week and pees often and has mixed urinary incontinence. She also has trouble swallowing at times.   Physical Exam:BP 114/80  Pulse 80  Ht 5' 3.5" (1.613 m)  Wt 224 lb (101.606 kg)  BMI 39.05 kg/m2 General:  Well developed, well nourished, no acute distress Skin:  Warm and dry Neck:  Midline trachea, normal thyroid Lungs; Clear to auscultation bilaterally Breast:  No dominant palpable mass, retraction, or nipple discharge Cardiovascular: Regular rate and rhythm Abdomen:  Soft, non tender, no hepatosplenomegaly, obes Pelvic:  External genitalia is normal in appearance.  The vagina has decrease color, moisture and rugae.The cervix and uterus are absent.  No    adnexal masses, has some global tenderness noted. Rectal: Good sphincter tone, no polyps, or hemorrhoids felt.  Hemoccult negative.+rectocele Extremities:  No swelling or varicosities noted Psych:  No mood changes, alert and cooperative,seems happy   Impression: Yearly gyn exam no pap Rectocele OAB Mixed UI Constipation    Plan: Rx myrbetriq 25 mg 1 daily # 30 with 6 refills Physical in 1 year Mammogram yearly just had one colonoscopy 2011 Review handout on UI

## 2013-11-11 ENCOUNTER — Ambulatory Visit (INDEPENDENT_AMBULATORY_CARE_PROVIDER_SITE_OTHER): Payer: 59 | Admitting: Adult Health

## 2013-11-11 ENCOUNTER — Encounter: Payer: Self-pay | Admitting: Adult Health

## 2013-11-11 VITALS — BP 120/60 | Ht 63.5 in | Wt 222.0 lb

## 2013-11-11 DIAGNOSIS — J018 Other acute sinusitis: Secondary | ICD-10-CM

## 2013-11-11 DIAGNOSIS — N3946 Mixed incontinence: Secondary | ICD-10-CM

## 2013-11-11 DIAGNOSIS — R42 Dizziness and giddiness: Secondary | ICD-10-CM | POA: Insufficient documentation

## 2013-11-11 DIAGNOSIS — J329 Chronic sinusitis, unspecified: Secondary | ICD-10-CM

## 2013-11-11 HISTORY — DX: Dizziness and giddiness: R42

## 2013-11-11 HISTORY — DX: Chronic sinusitis, unspecified: J32.9

## 2013-11-11 MED ORDER — OXYBUTYNIN CHLORIDE ER 10 MG PO TB24: 10.0000 mg | ORAL_TABLET | Freq: Every day | ORAL | Status: DC

## 2013-11-11 MED ORDER — MECLIZINE HCL 25 MG PO TABS: 25.0000 mg | ORAL_TABLET | Freq: Three times a day (TID) | ORAL | Status: DC | PRN

## 2013-11-11 MED ORDER — AZITHROMYCIN 250 MG PO TABS: ORAL_TABLET | ORAL | Status: DC

## 2013-11-11 NOTE — Patient Instructions (Signed)
Urinary Incontinence Urinary incontinence is the involuntary loss of urine from your bladder. CAUSES  There are many causes of urinary incontinence. They include:  Medicines.  Infections.  Prostatic enlargement, leading to overflow of urine from your bladder.  Surgery.  Neurological diseases.  Emotional factors. SIGNS AND SYMPTOMS Urinary Incontinence can be divided into four types: 1. Urge incontinence. Urge incontinence is the involuntary loss of urine before you have the opportunity to go to the bathroom. There is a sudden urge to void but not enough time to reach a bathroom. 2. Stress incontinence. Stress incontinence is the sudden loss of urine with any activity that forces urine to pass. It is commonly caused by anatomical changes to the pelvis and sphincter areas of your body. 3. Overflow incontinence. Overflow incontinence is the loss of urine from an obstructed opening to your bladder. This results in a backup of urine and a resultant buildup of pressure within the bladder. When the pressure within the bladder exceeds the closing pressure of the sphincter, the urine overflows, which causes incontinence, similar to water overflowing a dam. 4. Total incontinence. Total incontinence is the loss of urine as a result of the inability to store urine within your bladder. DIAGNOSIS  Evaluating the cause of incontinence may require:  A thorough and complete medical and obstetric history.  A complete physical exam.  Laboratory tests such as a urine culture and sensitivities. When additional tests are indicated, they can include:  An ultrasound exam.  Kidney and bladder X-rays.  Cystoscopy. This is an exam of the bladder using a narrow scope.  Urodynamic testing to test the nerve function to the bladder and sphincter areas. TREATMENT  Treatment for urinary incontinence depends on the cause:  For urge incontinence caused by a bacterial infection, antibiotics will be prescribed.  If the urge incontinence is related to medicines you take, your health care provider may have you change the medicine.  For stress incontinence, surgery to re-establish anatomical support to the bladder or sphincter, or both, will often correct the condition.  For overflow incontinence caused by an enlarged prostate, an operation to open the channel through the enlarged prostate will allow the flow of urine out of the bladder. In women with fibroids, a hysterectomy may be recommended.  For total incontinence, surgery on your urinary sphincter may help. An artificial urinary sphincter (an inflatable cuff placed around the urethra) may be required. In women who have developed a hole-like passage between their bladder and vagina (vesicovaginal fistula), surgery to close the fistula often is required. HOME CARE INSTRUCTIONS  Normal daily hygiene and the use of pads or adult diapers that are changed regularly will help prevent odors and skin damage.  Avoid caffeine. It can overstimulate your bladder.  Use the bathroom regularly. Try about every 2-3 hours to go to the bathroom, even if you do not feel the need to do so. Take time to empty your bladder completely. After urinating, wait a minute. Then try to urinate again.  For causes involving nerve dysfunction, keep a log of the medicines you take and a journal of the times you go to the bathroom. SEEK MEDICAL CARE IF:  You experience worsening of pain instead of improvement in pain after your procedure.  Your incontinence becomes worse instead of better. SEE IMMEDIATE MEDICAL CARE IF:  You experience fever or shaking chills.  You are unable to pass your urine.  You have redness spreading into your groin or down into your thighs. MAKE SURE   YOU:   Understand these instructions.   Will watch your condition.  Will get help right away if you are not doing well or get worse. Document Released: 03/17/2004 Document Revised: 11/28/2012 Document  Reviewed: 07/17/2012 [facility] Patient Information 2015 [place], [place]. This information is not intended to replace advice given to you by your health care provider. Make sure you discuss any questions you have with your health care provider. Vertigo Vertigo means you feel like you or your surroundings are moving when they are not. Vertigo can be dangerous if it occurs when you are at work, driving, or performing difficult activities.  CAUSES  Vertigo occurs when there is a conflict of signals sent to your brain from the visual and sensory systems in your body. There are many different causes of vertigo, including:  Infections, especially in the inner ear.  A bad reaction to a drug or misuse of alcohol and medicines.  Withdrawal from drugs or alcohol.  Rapidly changing positions, such as lying down or rolling over in bed.  A migraine headache.  Decreased blood flow to the brain.  Increased pressure in the brain from a head injury, infection, tumor, or bleeding. SYMPTOMS  You may feel as though the world is spinning around or you are falling to the ground. Because your balance is upset, vertigo can cause nausea and vomiting. You may have involuntary eye movements (nystagmus). DIAGNOSIS  Vertigo is usually diagnosed by physical exam. If the cause of your vertigo is unknown, your caregiver may perform imaging tests, such as an MRI scan (magnetic resonance imaging). TREATMENT  Most cases of vertigo resolve on their own, without treatment. Depending on the cause, your caregiver may prescribe certain medicines. If your vertigo is related to body position issues, your caregiver may recommend movements or procedures to correct the problem. In rare cases, if your vertigo is caused by certain inner ear problems, you may need surgery. HOME CARE INSTRUCTIONS   Follow your caregiver's instructions.  Avoid driving.  Avoid operating heavy machinery.  Avoid performing any tasks that would be  dangerous to you or others during a vertigo episode.  Tell your caregiver if you notice that certain medicines seem to be causing your vertigo. Some of the medicines used to treat vertigo episodes can actually make them worse in some people. SEEK IMMEDIATE MEDICAL CARE IF:   Your medicines do not relieve your vertigo or are making it worse.  You develop problems with talking, walking, weakness, or using your arms, hands, or legs.  You develop severe headaches.  Your nausea or vomiting continues or gets worse.  You develop visual changes.  A family member notices behavioral changes.  Your condition gets worse. MAKE SURE YOU:  Understand these instructions.  Will watch your condition.  Will get help right away if you are not doing well or get worse. Document Released: 11/17/2004 Document Revised: 05/02/2011 Document Reviewed: 08/26/2010 [facility] Patient Information 2015 [place], [place]. This information is not intended to replace advice given to you by your health care provider. Make sure you discuss any questions you have with your health care provider. Sinusitis Sinusitis is redness, soreness, and inflammation of the paranasal sinuses. Paranasal sinuses are air pockets within the bones of your face (beneath the eyes, the middle of the forehead, or above the eyes). In healthy paranasal sinuses, mucus is able to drain out, and air is able to circulate through them by way of your nose. However, when your paranasal sinuses are inflamed, mucus and air  can become trapped. This can allow bacteria and other germs to grow and cause infection. Sinusitis can develop quickly and last only a short time (acute) or continue over a long period (chronic). Sinusitis that lasts for more than 12 weeks is considered chronic.  CAUSES  Causes of sinusitis include:  Allergies.  Structural abnormalities, such as displacement of the cartilage that separates your nostrils (deviated septum), which can  decrease the air flow through your nose and sinuses and affect sinus drainage.  Functional abnormalities, such as when the small hairs (cilia) that line your sinuses and help remove mucus do not work properly or are not present. SIGNS AND SYMPTOMS  Symptoms of acute and chronic sinusitis are the same. The primary symptoms are pain and pressure around the affected sinuses. Other symptoms include:  Upper toothache.  Earache.  Headache.  Bad breath.  Decreased sense of smell and taste.  A cough, which worsens when you are lying flat.  Fatigue.  Fever.  Thick drainage from your nose, which often is green and may contain pus (purulent).  Swelling and warmth over the affected sinuses. DIAGNOSIS  Your health care provider will perform a physical exam. During the exam, your health care provider may:  Look in your nose for signs of abnormal growths in your nostrils (nasal polyps).  Tap over the affected sinus to check for signs of infection.  View the inside of your sinuses (endoscopy) using an imaging device that has a light attached (endoscope). If your health care provider suspects that you have chronic sinusitis, one or more of the following tests may be recommended:  Allergy tests.  Nasal culture. A sample of mucus is taken from your nose, sent to a lab, and screened for bacteria.  Nasal cytology. A sample of mucus is taken from your nose and examined by your health care provider to determine if your sinusitis is related to an allergy. TREATMENT  Most cases of acute sinusitis are related to a viral infection and will resolve on their own within 10 days. Sometimes medicines are prescribed to help relieve symptoms (pain medicine, decongestants, nasal steroid sprays, or saline sprays).  However, for sinusitis related to a bacterial infection, your health care provider will prescribe antibiotic medicines. These are medicines that will help kill the bacteria causing the infection.    Rarely, sinusitis is caused by a fungal infection. In theses cases, your health care provider will prescribe antifungal medicine. For some cases of chronic sinusitis, surgery is needed. Generally, these are cases in which sinusitis recurs more than 3 times per year, despite other treatments. HOME CARE INSTRUCTIONS   Drink plenty of water. Water helps thin the mucus so your sinuses can drain more easily.  Use a humidifier.  Inhale steam 3 to 4 times a day (for example, sit in the bathroom with the shower running).  Apply a warm, moist washcloth to your face 3 to 4 times a day, or as directed by your health care provider.  Use saline nasal sprays to help moisten and clean your sinuses.  Take medicines only as directed by your health care provider.  If you were prescribed either an antibiotic or antifungal medicine, finish it all even if you start to feel better. SEEK IMMEDIATE MEDICAL CARE IF:  You have increasing pain or severe headaches.  You have nausea, vomiting, or drowsiness.  You have swelling around your face.  You have vision problems.  You have a stiff neck.  You have difficulty breathing. MAKE SURE  YOU:   Understand these instructions.  Will watch your condition.  Will get help right away if you are not doing well or get worse. Document Released: 02/07/2005 Document Revised: 06/24/2013 Document Reviewed: 02/22/2011 [facility] Patient Information 2015 [place], [place]. This information is not intended to replace advice given to you by your health care provider. Make sure you discuss any questions you have with your health care provider. Take z pack  Try antivert Try ditropan for UI Call [facility] [phone]

## 2013-11-11 NOTE — Progress Notes (Signed)
Subjective:     Patient ID: Christina [name], female   DOB: December 06, 1960, 53 y.o.   MRN: [id number]  HPI [name] is a 53 year old white female in complaining of allergies and sinus pain and feels sea sick, and teeth hurt and has flutters in right ear. Did not get myrbetriq filled due to cost.Still with mixed UI.Has new dog.  Review of Systems See HPI Reviewed past medical,surgical, social and family history. Reviewed medications and allergies.     Objective:   Physical Exam BP 120/60  Ht 5' 3.5" (1.613 m)  Wt 222 lb (100.699 kg)  BMI 38.70 kg/m2   Skin warm and dry. Neck: mid line trachea, normal thyroid, or lymph nodes felt. Lungs: clear to ausculation bilaterally. Cardiovascular: regular rate and rhythm.Right ear mildly red, no tenderness with palpation but feels dizzy with quick change of position.  Assessment:     Vertigo Sinus infection  Mixed urinary incontinence    Plan:    Rx Z pack Rx Antivert 25 mg #30 no refills Rx ditropan XL 10 mg #30 1 daily with 11 refills Follow up prn  Given number for Dr Jeanelle Malling to recheck allergies Review handout on sinus infection,vertigo and UI

## 2014-10-13 ENCOUNTER — Other Ambulatory Visit: Payer: Self-pay | Admitting: Family Medicine

## 2014-10-13 DIAGNOSIS — Z1231 Encounter for screening mammogram for malignant neoplasm of breast: Secondary | ICD-10-CM

## 2014-12-22 ENCOUNTER — Ambulatory Visit (INDEPENDENT_AMBULATORY_CARE_PROVIDER_SITE_OTHER): Payer: 59

## 2014-12-22 ENCOUNTER — Ambulatory Visit (HOSPITAL_BASED_OUTPATIENT_CLINIC_OR_DEPARTMENT_OTHER)
Admission: RE | Admit: 2014-12-22 | Discharge: 2014-12-22 | Disposition: A | Discharge status: Home / Self Care | Payer: 59 | Source: Ambulatory Visit | Attending: Family Medicine | Admitting: Family Medicine

## 2014-12-22 ENCOUNTER — Ambulatory Visit (INDEPENDENT_AMBULATORY_CARE_PROVIDER_SITE_OTHER): Payer: 59 | Admitting: Podiatry

## 2014-12-22 ENCOUNTER — Encounter: Payer: Self-pay | Admitting: Podiatry

## 2014-12-22 VITALS — BP 150/90 | HR 84 | Resp 16

## 2014-12-22 DIAGNOSIS — Z1231 Encounter for screening mammogram for malignant neoplasm of breast: Secondary | ICD-10-CM | POA: Diagnosis not present

## 2014-12-22 DIAGNOSIS — M722 Plantar fascial fibromatosis: Secondary | ICD-10-CM

## 2014-12-22 MED ORDER — TRIAMCINOLONE ACETONIDE 10 MG/ML IJ SUSP
10.0000 mg | Freq: Once | INTRAMUSCULAR | Status: AC
  Administered 2014-12-22: 10 mg

## 2014-12-22 NOTE — Patient Instructions (Signed)

## 2014-12-22 NOTE — Progress Notes (Signed)
   Subjective:    Patient ID: Christina CaneGracie Christina [name], female    DOB: August 26, 1960, 54 y.o.   MRN: [id number]  HPI Pt presents with bilateral pf pain left over right   Review of Systems  Musculoskeletal: Positive for back pain, arthralgias and gait problem.  Neurological: Positive for light-headedness and headaches.  All other systems reviewed and are negative.      Objective:   Physical Exam        Assessment & Plan:

## 2014-12-24 NOTE — Progress Notes (Signed)
Subjective:     Patient ID: Christina [name], female   DOB: August 07, 1960, 54 y.o.   MRN: [id number]  HPI patient presents stating I have a lot of pain in my heel left over right that's been going on and I have had orthotics which are no longer supporting my arch and the pain is been present for several months left over right   Review of Systems  All other systems reviewed and are negative.      Objective:   Physical Exam  Constitutional: She is oriented to person, place, and time.  Cardiovascular: Intact distal pulses.   Musculoskeletal: Normal range of motion.  Neurological: She is oriented to person, place, and time.  Skin: Skin is warm.  Nursing note and vitals reviewed.  neurovascular status found to be intact muscle strength adequate range of motion within normal limits. Patient's noted to have intense discomfort plantar aspect heel left over right with inflammation and fluid around the medial band with depression of the arch noted bilateral and mild equinus condition noted. Patient has good digital perfusion and is well oriented 3     Assessment:      acute plantar fasciitis left over right with depression of the arch and mechanical dysfunction as part of the problem    Plan:      H&P and x-rays reviewed with patient. Today I injected the plantar fascial left 3 mg Kenalog 5 mill grams Xylocaine dispensed fascial brace and gave instructions for long-term physical therapy. I dispensed and instructed on physical therapy and shoe gear modifications and also scanned for custom orthotic devices

## 2015-01-16 ENCOUNTER — Encounter: Payer: Self-pay | Admitting: Family Medicine

## 2015-01-16 ENCOUNTER — Ambulatory Visit: Payer: 59 | Admitting: Pediatrics

## 2015-01-16 ENCOUNTER — Ambulatory Visit (INDEPENDENT_AMBULATORY_CARE_PROVIDER_SITE_OTHER): Payer: 59 | Admitting: Family Medicine

## 2015-01-16 VITALS — BP 130/81 | HR 81 | Temp 97.0°F | Ht 63.5 in | Wt 229.4 lb

## 2015-01-16 DIAGNOSIS — J3089 Other allergic rhinitis: Secondary | ICD-10-CM

## 2015-01-16 DIAGNOSIS — R300 Dysuria: Secondary | ICD-10-CM

## 2015-01-16 LAB — POCT UA - MICROSCOPIC ONLY
CRYSTALS, UR, HPF, POC: NEGATIVE
Casts, Ur, LPF, POC: NEGATIVE
Yeast, UA: NEGATIVE

## 2015-01-16 LAB — POCT URINALYSIS DIPSTICK
Bilirubin, UA: NEGATIVE
GLUCOSE UA: NEGATIVE
Ketones, UA: NEGATIVE
Leukocytes, UA: NEGATIVE
Nitrite, UA: NEGATIVE
PH UA: 5
PROTEIN UA: NEGATIVE
UROBILINOGEN UA: NEGATIVE

## 2015-01-16 MED ORDER — FLUTICASONE PROPIONATE 50 MCG/ACT NA SUSP: 1.0000 | Freq: Two times a day (BID) | NASAL | Status: DC | PRN

## 2015-01-16 MED ORDER — CETIRIZINE HCL 10 MG PO TABS: 10.0000 mg | ORAL_TABLET | Freq: Every day | ORAL | Status: DC

## 2015-01-16 MED ORDER — MOMETASONE FUROATE 50 MCG/ACT NA SUSP: 2.0000 | Freq: Every day | NASAL | Status: DC

## 2015-01-16 MED ORDER — SULFAMETHOXAZOLE-TRIMETHOPRIM 800-160 MG PO TABS: 1.0000 | ORAL_TABLET | Freq: Two times a day (BID) | ORAL | Status: DC

## 2015-01-16 NOTE — Progress Notes (Signed)
BP 130/81 mmHg  Pulse 81  Temp(Src) 97 F (36.1 C)  Ht 5' 3.5" (1.613 m)  Wt 229 lb 6.4 oz (104.055 kg)  BMI 39.99 kg/m2   Subjective:    Patient ID: Christina [name], female    DOB: 04/19/1960, 54 y.o.   MRN: [id number]  HPI: Christina [name] is a 54 y.o. female presenting on 01/16/2015 for Sinusitis and Urinary Tract Infection   HPI Dysuria Patient presents today because she has a one-week history of burning and pain when she urinates and malodorous urine. She says when she urinates at the urethral opening she experiences a burning sensation and pain. She has had urinary tract infections before and this is similar to what they feel like. She denies any fevers or chills. She denies any abdominal pain. She denies any flank pain.  Sinus pressure Patient is been having sinus pressure and congestion and pressure in her right ear. She also feels like her right ear is popping when she sneezes or blows her nose. She denies any fevers or chills. She does admit to having postnasal drainage and pressure behind her eyes. This is been going on for the past 3 days. She is tempted to use over-the-counter Alka-Seltzer and sinus and cold medication without much success.  Relevant past medical, surgical, family and social history reviewed and updated as indicated. Interim medical history since our last visit reviewed. Allergies and medications reviewed and updated.  Review of Systems  Constitutional: Negative for fever and chills.  HENT: Positive for congestion, postnasal drip, rhinorrhea, sinus pressure, sneezing and sore throat. Negative for ear discharge and ear pain.   Eyes: Negative for pain, redness and visual disturbance.  Respiratory: Positive for cough. Negative for chest tightness and shortness of breath.   Cardiovascular: Negative for chest pain and leg swelling.  Gastrointestinal: Negative for abdominal pain.  Genitourinary: Positive for dysuria, urgency and frequency. Negative for hematuria,  flank pain, decreased urine volume, vaginal bleeding, vaginal discharge, difficulty urinating, vaginal pain and menstrual problem.  Musculoskeletal: Negative for back pain and gait problem.  Skin: Negative for rash.  Neurological: Negative for light-headedness and headaches.  Psychiatric/Behavioral: Negative for behavioral problems and agitation.  All other systems reviewed and are negative.   Per HPI unless specifically indicated above     Medication List       This list is accurate as of: 01/16/15 12:08 PM.  Always use your most recent med list.               ADVIL CONGESTION RELIEF 10-200 MG Tabs  Generic drug:  Phenylephrine-Ibuprofen  Take 1 capsule by mouth as needed.     albuterol 108 (90 BASE) MCG/ACT inhaler  Commonly known as:  PROVENTIL HFA;VENTOLIN HFA  Inhale into the lungs every 6 (six) hours as needed for wheezing or shortness of breath.     budesonide-formoterol 160-4.5 MCG/ACT inhaler  Commonly known as:  SYMBICORT  Inhale 2 puffs into the lungs 2 (two) times daily.     cetirizine 10 MG tablet  Commonly known as:  ZYRTEC  Take 1 tablet (10 mg total) by mouth daily.     diphenhydrAMINE 25 MG tablet  Commonly known as:  SOMINEX  Take 25 mg by mouth as needed for sleep.     fluticasone 50 MCG/ACT nasal spray  Commonly known as:  FLONASE  Place 1 spray into both nostrils 2 (two) times daily as needed for allergies or rhinitis.     meclizine 25 MG tablet  Commonly known as:  ANTIVERT  Take 1 tablet (25 mg total) by mouth 3 (three) times daily as needed for dizziness.     mirabegron ER 25 MG Tb24 tablet  Commonly known as:  MYRBETRIQ  Take 1 tablet (25 mg total) by mouth daily.     oxybutynin 10 MG 24 hr tablet  Commonly known as:  DITROPAN-XL  Take 1 tablet (10 mg total) by mouth at bedtime.     pantoprazole 40 MG tablet  Commonly known as:  PROTONIX  Take 1 tablet (40 mg total) by mouth daily.     sulfamethoxazole-trimethoprim 800-160 MG  tablet  Commonly known as:  BACTRIM DS,SEPTRA DS  Take 1 tablet by mouth 2 (two) times daily.           Objective:    BP 130/81 mmHg  Pulse 81  Temp(Src) 97 F (36.1 C)  Ht 5' 3.5" (1.613 m)  Wt 229 lb 6.4 oz (104.055 kg)  BMI 39.99 kg/m2  Wt Readings from Last 3 Encounters:  01/16/15 229 lb 6.4 oz (104.055 kg)  11/11/13 222 lb (100.699 kg)  10/17/13 224 lb (101.606 kg)    Physical Exam  Constitutional: She is oriented to person, place, and time. She appears well-developed and well-nourished. No distress.  HENT:  Right Ear: Tympanic membrane, external ear and ear canal normal.  Left Ear: Tympanic membrane, external ear and ear canal normal.  Nose: Mucosal edema and rhinorrhea present. No epistaxis. Right sinus exhibits frontal sinus tenderness. Right sinus exhibits no maxillary sinus tenderness. Left sinus exhibits frontal sinus tenderness. Left sinus exhibits no maxillary sinus tenderness.  Mouth/Throat: Uvula is midline and mucous membranes are normal. Posterior oropharyngeal edema and posterior oropharyngeal erythema present. No oropharyngeal exudate or tonsillar abscesses.  Eyes: Conjunctivae and EOM are normal.  Neck: Neck supple. No thyromegaly present.  Cardiovascular: Normal rate, regular rhythm, normal heart sounds and intact distal pulses.   No murmur heard. Pulmonary/Chest: Effort normal and breath sounds normal. No respiratory distress. She has no wheezes.  Abdominal: She exhibits no distension. There is no tenderness. There is no rebound.  Musculoskeletal: Normal range of motion. She exhibits no edema or tenderness.  Lymphadenopathy:    She has no cervical adenopathy.  Neurological: She is alert and oriented to person, place, and time. Coordination normal.  Skin: Skin is warm and dry. No rash noted. She is not diaphoretic.  Psychiatric: She has a normal mood and affect. Her behavior is normal.  Vitals reviewed.   Results for orders placed or performed in visit  on 01/16/15  POCT UA - Microscopic Only  Result Value Ref Range   WBC, Ur, HPF, POC 1-3    RBC, urine, microscopic 1-2    Bacteria, U Microscopic many    Mucus, UA occ    Epithelial cells, urine per micros mod    Crystals, Ur, HPF, POC neg    Casts, Ur, LPF, POC neg    Yeast, UA neg   POCT urinalysis dipstick  Result Value Ref Range   Color, UA yel    Clarity, UA clr    Glucose, UA neg    Bilirubin, UA neg    Ketones, UA neg    Spec Grav, UA >=1.030    Blood, UA trc    pH, UA 5.0    Protein, UA neg    Urobilinogen, UA negative    Nitrite, UA neg    Leukocytes, UA Negative Negative      Assessment &  Plan:   Problem List Items Addressed This Visit    None    Visit Diagnoses    Dysuria    -  Primary    Urine did not show conclusively an infection but will treat based on symptoms.    Relevant Medications    sulfamethoxazole-trimethoprim (BACTRIM DS,SEPTRA DS) 800-160 MG tablet    Other Relevant Orders    POCT UA - Microscopic Only (Completed)    POCT urinalysis dipstick (Completed)    Other allergic rhinitis        Relevant Medications    cetirizine (ZYRTEC) 10 MG tablet        Follow up plan: Return if symptoms worsen or fail to improve.  Counseling provided for all of the vaccine components Orders Placed This Encounter  Procedures  . POCT UA - Microscopic Only  . POCT urinalysis dipstick    Arville CareJoshua Dettinger, MD [facility] 01/16/2015, 12:08 PM

## 2015-05-12 ENCOUNTER — Encounter: Payer: Self-pay | Admitting: Family Medicine

## 2015-05-12 ENCOUNTER — Ambulatory Visit (INDEPENDENT_AMBULATORY_CARE_PROVIDER_SITE_OTHER): Payer: 59 | Admitting: Family Medicine

## 2015-05-12 VITALS — BP 112/74 | HR 81 | Temp 97.2°F | Ht 63.5 in | Wt 233.0 lb

## 2015-05-12 DIAGNOSIS — E669 Obesity, unspecified: Secondary | ICD-10-CM

## 2015-05-12 DIAGNOSIS — J309 Allergic rhinitis, unspecified: Secondary | ICD-10-CM

## 2015-05-12 DIAGNOSIS — Z0000 Encounter for general adult medical examination without abnormal findings: Secondary | ICD-10-CM | POA: Insufficient documentation

## 2015-05-12 DIAGNOSIS — M25562 Pain in left knee: Secondary | ICD-10-CM | POA: Diagnosis not present

## 2015-05-12 MED ORDER — NAPROXEN 500 MG PO TABS: 500.0000 mg | ORAL_TABLET | Freq: Two times a day (BID) | ORAL | Status: DC

## 2015-05-12 NOTE — Patient Instructions (Addendum)
Great to meet you!  For your sinuses  Try zyrtec or allegra every day Add nasocort, this one doesn't burn as much Consider a Neti Pot (watch the youtube video) If these dont help I can send singulair  For your knee Try naproxen 2 times daily for 1 week Ice 15 min 3 times a day Wear a knee sleeve

## 2015-05-12 NOTE — Progress Notes (Signed)
   HPI  Patient presents today here to discuss a few issues and to get lab work.  Allergic rhinitis and headache Patient's when she has frequent frontal headache These are distinctly different than previous migraine headaches. 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 No discrete injuries. Has not tried any medications. Has not tried ice.  She is up-to-date with her colonoscopy and Pap smear She has a GYN.  She is fasting today  PMH: Smoking status noted ROS: Per HPI  Objective: BP 112/74 mmHg  Pulse 81  Temp(Src) 97.2 F (36.2 C) (Oral)  Ht 5' 3.5" (1.613 m)  Wt 233 lb (105.688 kg)  BMI 40.62 kg/m2 Gen: NAD, alert, cooperative with exam HEENT: NCAT, erythema and swelling of the nares, TMs normal, oropharynx clear CV: RRR, good S1/S2, no murmur Resp: CTABL, no wheezes, non-labored Ext: No edema, warm Neuro: Alert and oriented, No gross deficits  MSK: L knee without erythema, effusion, bruising, or gross deformity No joint line tenderness.  ligamentously intact to Lachman's and with varus and valgus stress.  Negative McMurray's test Tenderness to palpation of the left medial knee just distal to the medial joint line   Assessment and plan:  # Allergic rhinitis Zyrtec or Allegra daily, add Nasacort. Not tolerating Flonase due to burning sensation If Nasacort does not help would add Singulair. Discussed nasal saline flushes which she is not open to- [employer]  # Left medial knee pain Unclear etiology For now we'll try schedule NSAIDs 1 week, ice 1 week Compression Follow-up if not improved, consider referral to sports medicine for unique symptoms  # healthCare maintenance Discussed Pap smears and colonoscopies Labs     Orders Placed This Encounter  Procedures  . CMP14+EGFR    . CBC with Differential/Platelet  . TSH  . Lipid panel  . Hepatitis C antibody    Meds ordered this encounter  Medications  . naproxen (NAPROSYN) 500 MG tablet    Sig: Take 1 tablet (500 mg total) by mouth 2 (two) times daily with a meal.    Dispense:  30 tablet    Refill:  0    Laroy Apple, MD [facility] 05/12/2015, 2:14 PM

## 2015-05-13 LAB — CBC WITH DIFFERENTIAL/PLATELET
BASOS: 0 %
Basophils Absolute: 0 10*3/uL (ref 0.0–0.2)
EOS (ABSOLUTE): 0.3 10*3/uL (ref 0.0–0.4)
Eos: 5 %
HEMOGLOBIN: 13.2 g/dL (ref 11.1–15.9)
Hematocrit: 39.7 % (ref 34.0–46.6)
IMMATURE GRANS (ABS): 0 10*3/uL (ref 0.0–0.1)
IMMATURE GRANULOCYTES: 0 %
LYMPHS: 31 %
Lymphocytes Absolute: 2 10*3/uL (ref 0.7–3.1)
MCH: 27.9 pg (ref 26.6–33.0)
MCHC: 33.2 g/dL (ref 31.5–35.7)
MCV: 84 fL (ref 79–97)
MONOCYTES: 4 %
Monocytes Absolute: 0.3 10*3/uL (ref 0.1–0.9)
NEUTROS ABS: 3.9 10*3/uL (ref 1.4–7.0)
NEUTROS PCT: 60 %
PLATELETS: 263 10*3/uL (ref 150–379)
RBC: 4.73 x10E6/uL (ref 3.77–5.28)
RDW: 15.1 % (ref 12.3–15.4)
WBC: 6.6 10*3/uL (ref 3.4–10.8)

## 2015-05-13 LAB — CMP14+EGFR
ALT: 24 IU/L (ref 0–32)
AST: 23 IU/L (ref 0–40)
Albumin/Globulin Ratio: 1.6 (ref 1.2–2.2)
Albumin: 4.6 g/dL (ref 3.5–5.5)
Alkaline Phosphatase: 136 IU/L — ABNORMAL HIGH (ref 39–117)
BUN/Creatinine Ratio: 23 (ref 9–23)
BUN: 19 mg/dL (ref 6–24)
Bilirubin Total: 0.2 mg/dL (ref 0.0–1.2)
CALCIUM: 9.2 mg/dL (ref 8.7–10.2)
CO2: 18 mmol/L (ref 18–29)
CREATININE: 0.82 mg/dL (ref 0.57–1.00)
Chloride: 104 mmol/L (ref 96–106)
GFR, EST AFRICAN AMERICAN: 94 mL/min/{1.73_m2} (ref >59)
GFR, EST NON AFRICAN AMERICAN: 82 mL/min/{1.73_m2} (ref >59)
GLUCOSE: 117 mg/dL — AB (ref 65–99)
Globulin, Total: 2.8 g/dL (ref 1.5–4.5)
Potassium: 4.5 mmol/L (ref 3.5–5.2)
Sodium: 146 mmol/L — ABNORMAL HIGH (ref 134–144)
TOTAL PROTEIN: 7.4 g/dL (ref 6.0–8.5)

## 2015-05-13 LAB — LIPID PANEL
Chol/HDL Ratio: 5 ratio units — ABNORMAL HIGH (ref 0.0–4.4)
Cholesterol, Total: 245 mg/dL — ABNORMAL HIGH (ref 100–199)
HDL: 49 mg/dL (ref >39)
LDL Calculated: 167 mg/dL — ABNORMAL HIGH (ref 0–99)
TRIGLYCERIDES: 147 mg/dL (ref 0–149)
VLDL CHOLESTEROL CAL: 29 mg/dL (ref 5–40)

## 2015-05-13 LAB — HEPATITIS C ANTIBODY: HCV Ab: <0.1 s/co ratio (ref 0.0–0.9)

## 2015-05-13 LAB — TSH: TSH: 1.48 u[IU]/mL (ref 0.450–4.500)

## 2015-05-14 NOTE — Progress Notes (Signed)
Patient aware.

## 2015-06-10 ENCOUNTER — Encounter: Payer: Self-pay | Admitting: Family Medicine

## 2016-02-10 ENCOUNTER — Other Ambulatory Visit: Payer: Self-pay | Admitting: Family Medicine

## 2016-02-10 DIAGNOSIS — Z1231 Encounter for screening mammogram for malignant neoplasm of breast: Secondary | ICD-10-CM

## 2016-02-16 ENCOUNTER — Ambulatory Visit (HOSPITAL_BASED_OUTPATIENT_CLINIC_OR_DEPARTMENT_OTHER)
Admission: RE | Admit: 2016-02-16 | Discharge: 2016-02-16 | Disposition: A | Discharge status: Home / Self Care | Payer: 59 | Source: Ambulatory Visit | Attending: Family Medicine | Admitting: Family Medicine

## 2016-02-16 DIAGNOSIS — Z1231 Encounter for screening mammogram for malignant neoplasm of breast: Secondary | ICD-10-CM | POA: Insufficient documentation

## 2016-02-18 ENCOUNTER — Ambulatory Visit (HOSPITAL_BASED_OUTPATIENT_CLINIC_OR_DEPARTMENT_OTHER): Payer: 59

## 2016-03-11 ENCOUNTER — Ambulatory Visit: Payer: 59 | Admitting: Family Medicine

## 2016-03-14 ENCOUNTER — Encounter: Payer: Self-pay | Admitting: Family Medicine

## 2016-06-29 DIAGNOSIS — R197 Diarrhea, unspecified: Secondary | ICD-10-CM | POA: Diagnosis not present

## 2016-06-29 DIAGNOSIS — R112 Nausea with vomiting, unspecified: Secondary | ICD-10-CM | POA: Diagnosis not present

## 2016-06-29 DIAGNOSIS — Z1211 Encounter for screening for malignant neoplasm of colon: Secondary | ICD-10-CM | POA: Diagnosis not present

## 2016-07-07 DIAGNOSIS — K319 Disease of stomach and duodenum, unspecified: Secondary | ICD-10-CM | POA: Diagnosis not present

## 2016-07-07 DIAGNOSIS — R112 Nausea with vomiting, unspecified: Secondary | ICD-10-CM | POA: Diagnosis not present

## 2016-07-07 DIAGNOSIS — K2970 Gastritis, unspecified, without bleeding: Secondary | ICD-10-CM | POA: Diagnosis not present

## 2016-07-07 DIAGNOSIS — Z1211 Encounter for screening for malignant neoplasm of colon: Secondary | ICD-10-CM | POA: Diagnosis not present

## 2016-08-03 ENCOUNTER — Ambulatory Visit (INDEPENDENT_AMBULATORY_CARE_PROVIDER_SITE_OTHER): Payer: 59 | Admitting: Physician Assistant

## 2016-08-03 ENCOUNTER — Encounter: Payer: Self-pay | Admitting: Physician Assistant

## 2016-08-03 VITALS — BP 121/79 | HR 92 | Temp 97.9°F | Ht 63.5 in | Wt 225.0 lb

## 2016-08-03 DIAGNOSIS — G43109 Migraine with aura, not intractable, without status migrainosus: Secondary | ICD-10-CM | POA: Diagnosis not present

## 2016-08-03 DIAGNOSIS — R112 Nausea with vomiting, unspecified: Secondary | ICD-10-CM | POA: Diagnosis not present

## 2016-08-03 DIAGNOSIS — H579 Unspecified disorder of eye and adnexa: Secondary | ICD-10-CM | POA: Diagnosis not present

## 2016-08-03 NOTE — Patient Instructions (Signed)
Blurred Vision Having blurred vision means that you cannot see things clearly. Your vision may seem fuzzy or out of focus. Blurred vision is a very common symptom of an eye or vision problem. Blurred vision is often a gradual blur that occurs in one eye or both eyes. There are many causes of blurred vision, including cataracts, macular degeneration, and diabetic retinopathy. Blurred vision can be diagnosed based on your symptoms and a physical exam. Tell your health care provider about any other health problems you have, any recent eye injury, and any prior surgeries. You may need to see a health care provider who specializes in eye problems (ophthalmologist). Your treatment depends on what is causing your blurred vision.  HOME CARE INSTRUCTIONS  Tell your health care provider about any changes in your blurred vision.  Do not drive or operate heavy machinery if your vision is blurry.  Keep all follow-up visits as directed by your health care provider. This is important. SEEK MEDICAL CARE IF:  Your symptoms get worse.  You have new symptoms.  You have trouble seeing at night.  You have trouble seeing up close or far away.  You have trouble noticing the difference between colors. SEEK IMMEDIATE MEDICAL CARE IF:  You have severe eye pain.  You have a severe headache.  You have flashing lights in your field of vision.  You have a sudden change in vision.  You have a sudden loss of vision.  You have vision change after an injury.  You notice drainage coming from your eyes.  You notice a rash around your eyes. This information is not intended to replace advice given to you by your health care provider. Make sure you discuss any questions you have with your health care provider. Document Released: 02/10/2003 Document Revised: 06/24/2014 Document Reviewed: 01/01/2014 Elsevier Interactive Patient Education  2017 Elsevier Inc.  

## 2016-08-03 NOTE — Progress Notes (Signed)
Subjective:     Patient ID: Christina [name], female   DOB: [birth year], 56 y.o.   MRN: [id number]  HPI Pt with headache this am [place] [place] noted trouble with vision form the L eye There is a hx of migraine with ocular sx but usually affects the R side While she was at her appt it was noted her BP was sl elevated They recommended she take an ASA and f/u here Pt then went and took a Goody's and sx have since all resolved  Review of Systems  Constitutional: Negative.   HENT: Negative.   Eyes: Positive for visual disturbance. Negative for photophobia, pain, discharge and itching.  Respiratory: Negative.   Cardiovascular: Negative.   Gastrointestinal: Positive for nausea.  Neurological: Positive for headaches. Negative for syncope, facial asymmetry, speech difficulty, light-headedness and numbness.       Objective:   Physical Exam  Constitutional: She is oriented to person, place, and time. She appears well-developed and well-nourished.  HENT:  Mouth/Throat: Oropharynx is clear and moist.  Eyes: Conjunctivae and EOM are normal. Pupils are equal, round, and reactive to light.  Fundo- nl  Neck: Neck supple. No JVD present.  No bruits  Cardiovascular: Normal rate and regular rhythm.   No murmur heard. Pulmonary/Chest: Effort normal and breath sounds normal.  Musculoskeletal: She exhibits no deformity.  Neurological: She is alert and oriented to person, place, and time. No cranial nerve deficit. Coordination normal.  Nursing note and vitals reviewed. Vision per chart     Assessment:     1. Migraine equivalent        Plan:     Discussed possible migraine with ocular sx Start daily baby ASA Continue with her current meds Recommended eye exam If sx return also discussed Neuro referral Keep reg F/U

## 2016-10-05 ENCOUNTER — Ambulatory Visit (INDEPENDENT_AMBULATORY_CARE_PROVIDER_SITE_OTHER): Payer: 59

## 2016-10-05 ENCOUNTER — Ambulatory Visit (INDEPENDENT_AMBULATORY_CARE_PROVIDER_SITE_OTHER): Payer: 59 | Admitting: Pediatrics

## 2016-10-05 VITALS — BP 124/78 | HR 85 | Temp 97.3°F | Resp 18 | Ht 63.5 in | Wt 219.6 lb

## 2016-10-05 DIAGNOSIS — G4733 Obstructive sleep apnea (adult) (pediatric): Secondary | ICD-10-CM

## 2016-10-05 DIAGNOSIS — J329 Chronic sinusitis, unspecified: Secondary | ICD-10-CM

## 2016-10-05 DIAGNOSIS — R0602 Shortness of breath: Secondary | ICD-10-CM

## 2016-10-05 DIAGNOSIS — R51 Headache: Secondary | ICD-10-CM | POA: Diagnosis not present

## 2016-10-05 DIAGNOSIS — G8929 Other chronic pain: Secondary | ICD-10-CM

## 2016-10-05 DIAGNOSIS — G4452 New daily persistent headache (NDPH): Secondary | ICD-10-CM | POA: Diagnosis not present

## 2016-10-05 MED ORDER — AMITRIPTYLINE HCL 10 MG PO TABS: 10.0000 mg | ORAL_TABLET | Freq: Every day | ORAL | 0 refills | Status: DC

## 2016-10-05 MED ORDER — AMOXICILLIN-POT CLAVULANATE 875-125 MG PO TABS: 1.0000 | ORAL_TABLET | Freq: Two times a day (BID) | ORAL | 0 refills | Status: DC

## 2016-10-05 NOTE — Patient Instructions (Addendum)
Take amitriptyline 10mg  at night  Labs today  I have ordered MRI  You should hear about referral to neurology for headache and sleep apnea evaluation within the week  Take medicine to stop headache no more than twice a week to prevent rebound headache  Take antibiotic for sinus infection Continue flonase, antihistamine

## 2016-10-05 NOTE — Progress Notes (Signed)
Subjective:   Patient ID: Christina [name], female    DOB: [birth year], 56 y.o.   MRN: [id number] CC: Shortness of Breath (intermitent, worsening, "feel like I'm suffocating"g) and Headache (R side of head, intermitent x 3-4 years, worsening)  HPI: Christina [name] is a 56 y.o. female presenting for Shortness of Breath (intermitent, worsening, "feel like I'm suffocating"g) and Headache (R side of head, intermitent x 3-4 years, worsening)  H/o OSA: says the last time she had sleep study was 1993 Feels like mask is too tight Wakes up at night feleing like she is suffocating No SOB now No CP  Every morning the right side of her head has been hurting a lot  Treating HA with nasal saline, flonase, goody powders HA 5 days a week Somedays can't get out of bed until the afternoon because of pain Can't get out of bed until 3pm Has had migraines all her life, past 2 months has had worsening HA, has had to miss work This headache pain is different, more intense than migraine pain  Talked with dentist, doesn't think it is dental pain Says she can't function because of HA  Has tried topamax in the past, didn't help goodypowder helps take the edge off some HA sometimes in her eye, sometimes in her face, sometimes feels sharp R side of face under eye at times hurting  Had hysterectomy  Relevant past medical, surgical, family and social history reviewed. Allergies and medications reviewed and updated. History  Smoking Status  . Never Smoker  Smokeless Tobacco  . Never Used   ROS: Per HPI   Objective:    BP 124/78 (BP Location: Left Arm, Patient Position: Sitting, Cuff Size: Large)   Pulse 85   Temp (!) 97.3 F (36.3 C) (Oral)   Resp 18   Ht 5' 3.5" (1.613 m)   Wt 219 lb 9.6 oz (99.6 kg)   SpO2 98%   BMI 38.29 kg/m   Wt Readings from Last 3 Encounters:  10/05/16 219 lb 9.6 oz (99.6 kg)  08/03/16 225 lb (102.1 kg)  05/12/15 233 lb (105.7 kg)    Gen: NAD, alert, cooperative with exam,  NCAT EYES: EOMI, no conjunctival injection, or no icterus ENT:  TMs pearly gray b/l, OP without erythema, ttp over max sinuses LYMPH: no cervical LAD CV: NRRR, normal S1/S2, no murmur, distal pulses 2+ b/l Resp: CTABL, no wheezes, normal WOB Abd: +BS, soft, NTND. Ext: No edema, warm Neuro: Alert and oriented, strength equal b/l UE and LE, coordination normal, normal cerebellar function, sensation intact, CN III-XII intact  Assessment & Plan:  Aritha was seen today for multiple complaints.  Diagnoses and all orders for this visit:  OSA (obstructive sleep apnea) Last sleep study 1993 -     Ambulatory referral to Neurology  Chronic nonintractable headache, unspecified headache type Has chronic headaches but also recent wosening of R posterior headache, constant, severe Some pain R max sinus with increased nasal discharge Treating sinusitis Trial of below Imaging as below -     amitriptyline (ELAVIL) 10 MG tablet; Take 1 tablet (10 mg total) by mouth at bedtime.  New daily persistent headache -     MR Brain W Wo Contrast; Future -     CBC with Differential/Platelet -     CMP14+EGFR  Sinusitis, unspecified chronicity, unspecified location -     amoxicillin-clavulanate (AUGMENTIN) 875-125 MG tablet; Take 1 tablet by mouth 2 (two) times daily.  SOB (shortness of breath) Happens at night when she  wakes up, suspect related to OSA No chest pain   Follow up plan: Return in about 2 weeks (around 10/19/2016). Assunta Found, MD [place]

## 2016-10-06 LAB — CBC WITH DIFFERENTIAL/PLATELET
Basophils Absolute: 0 10*3/uL (ref 0.0–0.2)
Basos: 0 %
EOS (ABSOLUTE): 0.3 10*3/uL (ref 0.0–0.4)
EOS: 4 %
HEMATOCRIT: 37.6 % (ref 34.0–46.6)
Hemoglobin: 11.9 g/dL (ref 11.1–15.9)
IMMATURE GRANS (ABS): 0 10*3/uL (ref 0.0–0.1)
IMMATURE GRANULOCYTES: 0 %
LYMPHS ABS: 2 10*3/uL (ref 0.7–3.1)
Lymphs: 25 %
MCH: 25.5 pg — ABNORMAL LOW (ref 26.6–33.0)
MCHC: 31.6 g/dL (ref 31.5–35.7)
MCV: 81 fL (ref 79–97)
MONOCYTES: 6 %
MONOS ABS: 0.4 10*3/uL (ref 0.1–0.9)
NEUTROS ABS: 5.1 10*3/uL (ref 1.4–7.0)
Neutrophils: 65 %
Platelets: 302 10*3/uL (ref 150–379)
RBC: 4.66 x10E6/uL (ref 3.77–5.28)
RDW: 16.8 % — ABNORMAL HIGH (ref 12.3–15.4)
WBC: 7.8 10*3/uL (ref 3.4–10.8)

## 2016-10-06 LAB — CMP14+EGFR
ALBUMIN: 4.6 g/dL (ref 3.5–5.5)
ALT: 18 IU/L (ref 0–32)
AST: 19 IU/L (ref 0–40)
Albumin/Globulin Ratio: 1.8 (ref 1.2–2.2)
Alkaline Phosphatase: 139 IU/L — ABNORMAL HIGH (ref 39–117)
BUN / CREAT RATIO: 12 (ref 9–23)
BUN: 9 mg/dL (ref 6–24)
Bilirubin Total: 0.3 mg/dL (ref 0.0–1.2)
CALCIUM: 9.3 mg/dL (ref 8.7–10.2)
CO2: 24 mmol/L (ref 20–29)
CREATININE: 0.73 mg/dL (ref 0.57–1.00)
Chloride: 103 mmol/L (ref 96–106)
GFR calc Af Amer: 107 mL/min/{1.73_m2} (ref >59)
GFR, EST NON AFRICAN AMERICAN: 93 mL/min/{1.73_m2} (ref >59)
GLOBULIN, TOTAL: 2.6 g/dL (ref 1.5–4.5)
Glucose: 110 mg/dL — ABNORMAL HIGH (ref 65–99)
Potassium: 4.7 mmol/L (ref 3.5–5.2)
SODIUM: 141 mmol/L (ref 134–144)
TOTAL PROTEIN: 7.2 g/dL (ref 6.0–8.5)

## 2016-10-11 ENCOUNTER — Telehealth: Payer: Self-pay | Admitting: Family Medicine

## 2016-10-12 ENCOUNTER — Other Ambulatory Visit: Payer: Self-pay | Admitting: Pediatrics

## 2016-10-12 NOTE — Telephone Encounter (Signed)
Called pt back, left a message to call back. She had a reaction to IV contrast used in a CT scan in the past. It is a different kind of contrast used for MRI, she hasnt had a reaction to it in the past per our discussion at her clinic visit. We will be able to see the brain better with contrast. Is she still having headaches?

## 2016-10-12 NOTE — Telephone Encounter (Signed)
lmtcb

## 2016-10-12 NOTE — Telephone Encounter (Signed)
FYI.Marland KitchenMarland KitchenMarland KitchenHer appointment is tomorrow

## 2016-10-13 ENCOUNTER — Encounter (HOSPITAL_COMMUNITY): Payer: Self-pay

## 2016-10-13 ENCOUNTER — Ambulatory Visit (HOSPITAL_COMMUNITY)
Admission: RE | Admit: 2016-10-13 | Discharge: 2016-10-13 | Disposition: A | Discharge status: Home / Self Care | Payer: 59 | Source: Ambulatory Visit | Attending: Pediatrics | Admitting: Pediatrics

## 2016-10-13 DIAGNOSIS — G4452 New daily persistent headache (NDPH): Secondary | ICD-10-CM

## 2016-10-20 ENCOUNTER — Ambulatory Visit: Payer: 59 | Admitting: Pediatrics

## 2016-10-27 ENCOUNTER — Encounter: Payer: 59 | Admitting: Pediatrics

## 2016-11-03 ENCOUNTER — Ambulatory Visit (INDEPENDENT_AMBULATORY_CARE_PROVIDER_SITE_OTHER): Payer: 59 | Admitting: Neurology

## 2016-11-03 ENCOUNTER — Encounter: Payer: Self-pay | Admitting: Neurology

## 2016-11-03 VITALS — BP 137/93 | HR 78 | Ht 63.5 in | Wt 215.0 lb

## 2016-11-03 DIAGNOSIS — G2581 Restless legs syndrome: Secondary | ICD-10-CM | POA: Diagnosis not present

## 2016-11-03 DIAGNOSIS — R51 Headache: Secondary | ICD-10-CM

## 2016-11-03 DIAGNOSIS — R351 Nocturia: Secondary | ICD-10-CM

## 2016-11-03 DIAGNOSIS — G4761 Periodic limb movement disorder: Secondary | ICD-10-CM

## 2016-11-03 DIAGNOSIS — G43009 Migraine without aura, not intractable, without status migrainosus: Secondary | ICD-10-CM

## 2016-11-03 DIAGNOSIS — R519 Headache, unspecified: Secondary | ICD-10-CM

## 2016-11-03 DIAGNOSIS — Z789 Other specified health status: Secondary | ICD-10-CM

## 2016-11-03 DIAGNOSIS — E669 Obesity, unspecified: Secondary | ICD-10-CM | POA: Diagnosis not present

## 2016-11-03 DIAGNOSIS — G4733 Obstructive sleep apnea (adult) (pediatric): Secondary | ICD-10-CM | POA: Diagnosis not present

## 2016-11-03 DIAGNOSIS — G4719 Other hypersomnia: Secondary | ICD-10-CM

## 2016-11-03 NOTE — Progress Notes (Signed)
Subjective:    Patient ID: Christina [name] is a 56 y.o. female.  HPI     Christina Foley, MD, PhD [facility] [address] [address] [place], [place] [address]  Dear Dr. Oswaldo [name],   I saw your patient, Christina [name], upon your kind request, in my neurologic clinic today for initial consultation of her sleep disorder, in particular reevaluation of her prior diagnosis of OSA. She also has recurrent headaches. The patient is unaccompanied today. As you know, Christina [name] is a 56 year old right-handed [name] with an underlying medical history of reflux disease, asthma, allergies, history of vertigo and obesity, who was previously diagnosed with obstructive sleep apnea in place on CPAP therapy. As I understand, her sleep study was in the 90s and she still has her old machine. Prior sleep studies are not available for my review today. A CPAP download is not available for my review today. For headaches which have been mostly right sided she was tried on Topamax in the past as I understand. She occasionally takes Goody powder which helps to some degree. She was recently started on amitriptyline. I reviewed your office note from 10/05/2016. You also ordered a brain MRI with and without contrast which is pending, actually tried going through with it, but reports anxiety attack during the scan.  Her Epworth Sleepiness Scale score is 16 out of 24 today, fatigue score is 54 out of 63. She is married and lives with her husband. She has no children. She is a nonsmoker, does not utilize alcohol, drugs caffeine in the form of tea, 2-3 glasses per day. She works in [employer] since she was a teenager, not in the last 3 months. She has severe nocturia at least 4 times per night. She has had recurrent HAs, migraines since childhood. She has had morning headaches.  She stopped her CPAP about 6 weeks ago, it is falling apart and mask is very old, does not have a current DME. Has moved a lot to  different.  She hardly sleeps in the same bed with her husband, is restless, endorses RLS issues and has leg movements.   Her Past Medical History Is Significant For: Past Medical History:  Diagnosis Date  . Allergy   . Asthma   . Constipation 10/17/2013  . GERD (gastroesophageal reflux disease)   . Hiatal hernia   . OAB (overactive bladder) 10/17/2013  . Obesity   . Rectocele 10/17/2013  . Sinus infection 11/11/2013  . Urinary incontinence 10/17/2013  . Vertigo 11/11/2013    Her Past Surgical History Is Significant For: Past Surgical History:  Procedure Laterality Date  . ABDOMINAL HYSTERECTOMY    . KNEE SURGERY Left     Her Family History Is Significant For: Family History  Problem Relation Age of Onset  . Cancer Mother        skin  . Arthritis Mother   . Other Mother        chronic diarrhea  . Leukemia Father   . Cancer Father        lung  . Other Father        polio as a child  . Stroke Father   . Thyroid disease Father   . Cancer Sister        breast  . Bipolar disorder Brother   . Heart attack Maternal Grandmother   . Macular degeneration Maternal Grandmother   . Other Maternal Grandfather        migraines  . Stroke  Maternal Grandfather   . Hypertension Paternal Grandmother   . Alzheimer's disease Paternal Grandfather   . Thyroid disease Sister   . Alcohol abuse Brother     Her Social History Is Significant For: Social History   Social History  . Marital status: Married    Spouse name: N/A  . Number of children: N/A  . Years of education: N/A   Social History Main Topics  . Smoking status: Never Smoker  . Smokeless tobacco: Never Used  . Alcohol use No  . Drug use: No  . Sexual activity: Not Currently    Birth control/ protection: Surgical   Other Topics Concern  . None   Social History Narrative  . None    Her Allergies Are:  Allergies  Allergen Reactions  . Ivp Dye [Iodinated Diagnostic Agents]     STOP BREATHING  . Other      Smoke- wheezing, migraines  . Shellfish Allergy   :   Her Current Medications Are:  Outpatient Encounter Prescriptions as of 11/03/2016  Medication Sig  . albuterol (PROVENTIL HFA;VENTOLIN HFA) 108 (90 BASE) MCG/ACT inhaler Inhale into the lungs every 6 (six) hours as needed for wheezing or shortness of breath.  Marland Kitchen amitriptyline (ELAVIL) 10 MG tablet Take 1 tablet (10 mg total) by mouth at bedtime.  . budesonide-formoterol (SYMBICORT) 160-4.5 MCG/ACT inhaler Inhale 2 puffs into the lungs 2 (two) times daily.  . cetirizine (ZYRTEC) 10 MG tablet Take 1 tablet (10 mg total) by mouth daily.  . mometasone (NASONEX) 50 MCG/ACT nasal spray Place 2 sprays into the nose daily.  . pantoprazole (PROTONIX) 40 MG tablet Take 1 tablet (40 mg total) by mouth daily.  . [DISCONTINUED] amoxicillin-clavulanate (AUGMENTIN) 875-125 MG tablet Take 1 tablet by mouth 2 (two) times daily.  . [DISCONTINUED] naproxen (NAPROSYN) 500 MG tablet Take 1 tablet (500 mg total) by mouth 2 (two) times daily with a meal. (Patient not taking: Reported on 08/03/2016)   No facility-administered encounter medications on file as of 11/03/2016.   :  Review of Systems:  Out of a complete 14 point review of systems, all are reviewed and negative with the exception of these symptoms as listed below: Review of Systems  Neurological:       Pt presents today to discuss her sleep. Pt has a cpap that she estimates to be about 55 years old. Pt had a sleep study "a long time ago." Pt does not use a DME.  Epworth Sleepiness Scale 0= would never doze 1= slight chance of dozing 2= moderate chance of dozing 3= high chance of dozing  Sitting and reading: 2 Watching TV: 2 Sitting inactive in a public place (ex. Theater or meeting): 2 As a passenger in a car for an hour without a break: 2 Lying down to rest in the afternoon: 3 Sitting and talking to someone: 2 Sitting quietly after lunch (no alcohol): 2 In a car, while stopped in traffic:  1 Total: 16     Objective:  Neurological Exam  Physical Exam Physical Examination:   Vitals:   11/03/16 0814  BP: (!) 137/93  Pulse: 78   General Examination: The patient is a very pleasant 56 y.o. female in no acute distress. She appears well-developed and well-nourished and well groomed.   HEENT: Normocephalic, atraumatic, pupils are equal, round and reactive to light and accommodation. Extraocular tracking is good without limitation to gaze excursion or nystagmus noted. Normal smooth pursuit is noted. Hearing is grossly intact. Face is  symmetric with normal facial animation and normal facial sensation. Speech is clear with no dysarthria noted. There is no hypophonia. There is no lip, neck/head, jaw or voice tremor. Neck is supple with full range of passive and active motion. There are no carotid bruits on auscultation. Oropharynx exam reveals: mild mouth dryness, adequate dental hygiene and moderate airway crowding, due to quite small airway opening, larger appearing uvula, tonsils in place. Mallampati is class II. Tongue protrudes centrally and palate elevates symmetrically. Neck size is 17 inches. She has a Mild overbite.   Chest: Clear to auscultation without wheezing, rhonchi or crackles noted.  Heart: S1+S2+0, regular and normal without murmurs, rubs or gallops noted.   Abdomen: Soft, non-tender and non-distended with normal bowel sounds appreciated on auscultation.  Extremities: There is no pitting edema in the distal lower extremities bilaterally. Pedal pulses are intact.  Skin: Warm and dry without trophic changes noted.  Musculoskeletal: exam reveals no obvious joint deformities, tenderness or joint swelling or erythema.   Neurologically:  Mental status: The patient is awake, alert and oriented in all 4 spheres. Her immediate and remote memory, attention, language skills and fund of knowledge are appropriate. There is no evidence of aphasia, agnosia, apraxia or anomia.  Speech is clear with normal prosody and enunciation. Thought process is linear. Mood is normal and affect is normal.  Cranial nerves II - XII are as described above under HEENT exam. In addition: shoulder shrug is normal with equal shoulder height noted. Motor exam: Normal bulk, strength and tone is noted. There is no drift, tremor or rebound. Romberg is negative. Reflexes are 2+ throughout. Babinski: Toes are flexor bilaterally. Fine motor skills and coordination: intact with normal finger taps, normal hand movements, normal rapid alternating patting, normal foot taps and normal foot agility.  Cerebellar testing: No dysmetria or intention tremor on finger to nose testing. Heel to shin is unremarkable bilaterally. There is no truncal or gait ataxia.  Sensory exam: intact to light touch in the upper and lower extremities.  Gait, station and balance: She stands easily. No veering to one side is noted. No leaning to one side is noted. Posture is age-appropriate and stance is narrow based. Gait shows normal stride length and normal pace. No problems turning are noted. Tandem walk is unremarkable.            Assessment and Plan:  In summary, Delbra Zellars is a very pleasant 56 y.o.-year old female with an underlying medical history of reflux disease, asthma, allergies, history of vertigo and obesity, who has a longstanding history of obstructive sleep apnea (OSA).  She has a very old CPAP machine and it is defective.  In addition, she reports telltale symptoms of restless leg syndrome and leg movements at night, leg rubbing motions prior to falling asleep. She has had recurrent headaches, reports a diagnosis of migraines since childhood. She has been started of Elavil recently, was not able to go through with an MRI brain recently, d/t anxiety attack, as I understand.   I had a long chat with the patient about my findings and the diagnosis of OSA, its prognosis and treatment options. We talked about medical  treatments, surgical interventions and non-pharmacological approaches. I explained in particular the risks and ramifications of untreated moderate to severe OSA, especially with respect to developing cardiovascular disease down the Road, including congestive heart failure, difficult to treat hypertension, cardiac arrhythmias, or stroke. Even type 2 diabetes has, in part, been linked to untreated OSA. Symptoms  of untreated OSA include daytime sleepiness, memory problems, mood irritability and mood disorder such as depression and anxiety, lack of energy, as well as recurrent headaches, especially morning headaches. We talked about trying to maintain a healthy lifestyle in general, as well as the importance of weight control. I encouraged the patient to eat healthy, exercise daily and keep well hydrated, to keep a scheduled bedtime and wake time routine, to not skip any meals and eat healthy snacks in between meals. I advised the patient not to drive when feeling sleepy. I recommended the following at this time: sleep study with potential positive airway pressure titration. (We will score hypopneas at 4%).   I explained the sleep test procedure to the patient and also outlined possible surgical and non-surgical treatment options of OSA, including the use of a custom-made dental device (which would require a referral to a specialist dentist or oral surgeon), upper airway surgical options, such as pillar implants, radiofrequency surgery, tongue base surgery, and UPPP (which would involve a referral to an ENT surgeon). Rarely, jaw surgery such as mandibular advancement may be considered.  I also explained the CPAP treatment option to the patient, who indicated that she would be willing to continue with CPAP if the need arises. I explained the importance of being compliant with PAP treatment, not only for insurance purposes but primarily to improve Her symptoms, and for the patient's long term health benefit, including  to reduce Her cardiovascular risks. I answered all her questions today and the patient was in agreement. I would like to see her back after the sleep study is completed and encouraged her to call with any interim questions, concerns, problems or updates.   Thank you very much for allowing me to participate in the care of this nice patient. If I can be of any further assistance to you please do not hesitate to call me at [phone].  Sincerely,   Christina FoleySaima Brynn Mulgrew, MD, PhD

## 2016-11-03 NOTE — Patient Instructions (Signed)
It was nice to meet you!  Here is what we discussed today and what we came up with as our plan for you:    Based on your symptoms and your exam I believe you are still at risk for obstructive sleep apnea or OSA, and I think we should proceed with a sleep study to determine how severe it is. If you have more than mild OSA, I want you to consider treatment with CPAP. Please remember, the risks and ramifications of moderate to severe obstructive sleep apnea or OSA are: Cardiovascular disease, including congestive heart failure, stroke, difficult to control hypertension, arrhythmias, and even type 2 diabetes has been linked to untreated OSA. Sleep apnea causes disruption of sleep and sleep deprivation in most cases, which, in turn, can cause recurrent headaches, problems with memory, mood, concentration, focus, and vigilance. Most people with untreated sleep apnea report excessive daytime sleepiness, which can affect their ability to drive. Please do not drive if you feel sleepy.   I will likely see you back after your sleep study to go over the test results and where to go from there. We will call you after your sleep study to advise about the results (most likely, you will hear from GretnaKristen, my nurse) and to set up an appointment at the time, as necessary.    Our sleep lab administrative assistant, Alvis LemmingsDawn will call you to schedule your sleep study. If you don't hear back from her by about 2 weeks from now, please feel free to call her at [phone]. You can leave a message with your phone number and concerns, if you get the voicemail box. She will call back as soon as possible.

## 2016-11-16 ENCOUNTER — Telehealth: Payer: Self-pay | Admitting: Neurology

## 2016-11-16 DIAGNOSIS — G4733 Obstructive sleep apnea (adult) (pediatric): Secondary | ICD-10-CM

## 2016-11-16 NOTE — Telephone Encounter (Signed)
[employer] denied Split sleep and approved HST.  Can I get an order for HST? °

## 2016-11-17 NOTE — Telephone Encounter (Signed)
2 mess left for pt to call back -- note to be closed

## 2016-11-28 ENCOUNTER — Ambulatory Visit (INDEPENDENT_AMBULATORY_CARE_PROVIDER_SITE_OTHER): Payer: 59 | Admitting: Family Medicine

## 2016-11-28 ENCOUNTER — Encounter: Payer: Self-pay | Admitting: Family Medicine

## 2016-11-28 VITALS — BP 108/74 | HR 72 | Temp 97.2°F | Ht 63.5 in | Wt 214.6 lb

## 2016-11-28 DIAGNOSIS — N3000 Acute cystitis without hematuria: Secondary | ICD-10-CM | POA: Diagnosis not present

## 2016-11-28 DIAGNOSIS — R300 Dysuria: Secondary | ICD-10-CM | POA: Diagnosis not present

## 2016-11-28 LAB — URINALYSIS, COMPLETE
BILIRUBIN UA: NEGATIVE
LEUKOCYTES UA: NEGATIVE
Nitrite, UA: POSITIVE — AB
SPEC GRAV UA: 1.025 (ref 1.005–1.030)
Urobilinogen, Ur: 4 mg/dL — ABNORMAL HIGH (ref 0.2–1.0)
pH, UA: 5 (ref 5.0–7.5)

## 2016-11-28 LAB — MICROSCOPIC EXAMINATION
Epithelial Cells (non renal): >10 /hpf — AB (ref 0–10)
Renal Epithel, UA: NONE SEEN /hpf

## 2016-11-28 MED ORDER — CEPHALEXIN 500 MG PO CAPS: 500.0000 mg | ORAL_CAPSULE | Freq: Three times a day (TID) | ORAL | 0 refills | Status: DC

## 2016-11-28 NOTE — Progress Notes (Signed)
   HPI  Patient presents today here with concern for UTI.  Patient states that she's had knifelike dysuria for about 2 weeks, she's also had urinary urgency and bladder spasms with small amounts of urine coming out with urgent symptoms.  She denies fever, chills, sweats. She's tolerating food and fluids at usual.   PMH: Smoking status noted ROS: Per HPI  Objective: BP 108/74   Pulse 72   Temp (!) 97.2 F (36.2 C) (Oral)   Ht 5' 3.5" (1.613 m)   Wt 214 lb 9.6 oz (97.3 kg)   BMI 37.42 kg/m  Gen: NAD, alert, cooperative with exam HEENT: NCAT CV: RRR, good S1/S2, no murmur Resp: CTABL, no wheezes, non-labored Abd: Soft, no guarding, mild tenderness to palpation of the suprapubic area, also mild tenderness over the bilateral CVA areas Ext: No edema, warm Neuro: Alert and oriented, No gross deficits  Assessment and plan:  # Acute cystitis Treat with Keflex Culture Cipro avoided given construction work, to avoid possibility of tendinopathy RTC with any concerns- Mild CVA discomfort unlikely a true positive exam.     Orders Placed This Encounter  Procedures  . Urine Culture  . Urinalysis, Complete    Meds ordered this encounter  Medications  . cephALEXin (KEFLEX) 500 MG capsule    Sig: Take 1 capsule (500 mg total) by mouth 3 (three) times daily.    Dispense:  21 capsule    Refill:  0    Murtis Sink, MD [facility] 11/28/2016, 10:57 AM

## 2016-11-28 NOTE — Patient Instructions (Signed)

## 2016-11-29 LAB — URINE CULTURE

## 2016-12-12 ENCOUNTER — Encounter: Payer: 59 | Admitting: Neurology

## 2016-12-26 ENCOUNTER — Telehealth: Payer: Self-pay

## 2016-12-26 ENCOUNTER — Ambulatory Visit (INDEPENDENT_AMBULATORY_CARE_PROVIDER_SITE_OTHER): Payer: 59 | Admitting: Neurology

## 2016-12-26 DIAGNOSIS — G4733 Obstructive sleep apnea (adult) (pediatric): Secondary | ICD-10-CM

## 2016-12-26 DIAGNOSIS — R0683 Snoring: Secondary | ICD-10-CM

## 2016-12-26 DIAGNOSIS — R51 Headache: Secondary | ICD-10-CM

## 2016-12-26 DIAGNOSIS — Z789 Other specified health status: Secondary | ICD-10-CM

## 2016-12-26 DIAGNOSIS — G43009 Migraine without aura, not intractable, without status migrainosus: Secondary | ICD-10-CM

## 2016-12-26 DIAGNOSIS — R519 Headache, unspecified: Secondary | ICD-10-CM

## 2016-12-26 DIAGNOSIS — G4761 Periodic limb movement disorder: Secondary | ICD-10-CM

## 2016-12-26 DIAGNOSIS — G4719 Other hypersomnia: Secondary | ICD-10-CM

## 2016-12-26 DIAGNOSIS — G2581 Restless legs syndrome: Secondary | ICD-10-CM

## 2016-12-26 DIAGNOSIS — G4720 Circadian rhythm sleep disorder, unspecified type: Secondary | ICD-10-CM

## 2016-12-26 DIAGNOSIS — E669 Obesity, unspecified: Secondary | ICD-10-CM

## 2016-12-26 DIAGNOSIS — R351 Nocturia: Secondary | ICD-10-CM

## 2016-12-26 NOTE — Telephone Encounter (Signed)
Had an appt with [facility] Saturday and when I got there they said there was a problem  It took me forever to get this appt   (I see she had appt 11/03/16 with them so I don't know why she was going in Oct)  Anyway can I have an appt with Dr Gerilyn Pilgrimoonquah in [place] for Sleep study

## 2016-12-27 ENCOUNTER — Ambulatory Visit (INDEPENDENT_AMBULATORY_CARE_PROVIDER_SITE_OTHER): Payer: 59

## 2016-12-27 ENCOUNTER — Ambulatory Visit (INDEPENDENT_AMBULATORY_CARE_PROVIDER_SITE_OTHER): Payer: 59 | Admitting: Nurse Practitioner

## 2016-12-27 ENCOUNTER — Encounter: Payer: Self-pay | Admitting: Nurse Practitioner

## 2016-12-27 DIAGNOSIS — R1084 Generalized abdominal pain: Secondary | ICD-10-CM

## 2016-12-27 DIAGNOSIS — R110 Nausea: Secondary | ICD-10-CM

## 2016-12-27 DIAGNOSIS — Z23 Encounter for immunization: Secondary | ICD-10-CM | POA: Diagnosis not present

## 2016-12-27 DIAGNOSIS — S3991XA Unspecified injury of abdomen, initial encounter: Secondary | ICD-10-CM | POA: Diagnosis not present
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
## 2016-12-29 ENCOUNTER — Telehealth: Payer: Self-pay

## 2016-12-29 NOTE — Telephone Encounter (Signed)
-----   Message from Huston FoleySaima Athar, MD sent at 12/29/2016  7:52 AM EST ----- Patient referred by Dr. Oswaldo DoneVincent, seen by me on 11/03/16, diagnostic PSG on 12/26/16.   Please call and notify the patient that the recent sleep study did not show any significant obstructive sleep apnea. Mild intermittent snoring was noted. For disturbing snoring, an oral appliance (through a qualified dentist) can be considered. 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 For this, weight loss and avoidance of the supine sleep position are recommended.  Please remind patient to try to maintain good sleep hygiene, which means: Keep a regular sleep and wake schedule and make enough time for sleep (7 1/2 to 8 1/2 hours for the average adult), try not to exercise or have a meal within 2 hours of your bedtime, try to keep your bedroom conducive for sleep, that is, cool and dark, without light distractors such as an illuminated alarm clock, and refrain from watching TV right before sleep or in the middle of the night and do not keep the TV or radio on during the night. If a nightlight is used, have it away from the visual field. Also, try not to use or play on electronic devices at bedtime, such as your cell phone, tablet PC or laptop. If you like to read at bedtime on an electronic device, try to dim the background light as much as possible. Do not eat in the middle of the night. Keep pets away from the bedroom environment. For stress relief, try meditation, deep breathing exercises (there are many books and CDs available), a white noise machine or fan can help to diffuse other noise distractors, such as traffic noise. Do not drink alcohol before bedtime, as it can disturb sleep and cause middle of the night awakenings. Never mix alcohol and sedating medications! Avoid narcotic pain medication close to bedtime, as opioids/narcotics can suppress breathing drive and breathing effort.   Please  inform patient that she can follow up with her referring provider.   Once you have spoken to patient, you can close this encounter.   Thanks,  Huston FoleySaima Athar, MD, PhD [facility]

## 2016-12-29 NOTE — Progress Notes (Signed)
Patient referred by Dr. Oswaldo DoneVincent, seen by me on 11/03/16, diagnostic PSG on 12/26/16.   Please call and notify the patient that the recent sleep study did not show any significant obstructive sleep apnea. Mild intermittent snoring was noted. For disturbing snoring, an oral appliance (through a qualified dentist) can be considered. 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 For this, weight loss and avoidance of the supine sleep position are recommended.  Please remind patient to try to maintain good sleep hygiene, which means: Keep a regular sleep and wake schedule and make enough time for sleep (7 1/2 to 8 1/2 hours for the average adult), try not to exercise or have a meal within 2 hours of your bedtime, try to keep your bedroom conducive for sleep, that is, cool and dark, without light distractors such as an illuminated alarm clock, and refrain from watching TV right before sleep or in the middle of the night and do not keep the TV or radio on during the night. If a nightlight is used, have it away from the visual field. Also, try not to use or play on electronic devices at bedtime, such as your cell phone, tablet PC or laptop. If you like to read at bedtime on an electronic device, try to dim the background light as much as possible. Do not eat in the middle of the night. Keep pets away from the bedroom environment. For stress relief, try meditation, deep breathing exercises (there are many books and CDs available), a white noise machine or fan can help to diffuse other noise distractors, such as traffic noise. Do not drink alcohol before bedtime, as it can disturb sleep and cause middle of the night awakenings. Never mix alcohol and sedating medications! Avoid narcotic pain medication close to bedtime, as opioids/narcotics can suppress breathing drive and breathing effort.   Please inform patient that she can follow up with her referring provider.   Once  you have spoken to patient, you can close this encounter.   Thanks,  Huston FoleySaima Alyviah Crandle, MD, PhD [facility])

## 2016-12-29 NOTE — Telephone Encounter (Signed)
Patient called in and is requesting a call back. Please call and advise.

## 2016-12-29 NOTE — Telephone Encounter (Signed)
I called pt to discuss her sleep study results. No answer, left a message asking her to call me back. 

## 2016-12-29 NOTE — Telephone Encounter (Signed)
Patient home study did not not have any data. Patient could not get it working properly. She wanted an in lab study. [employer] approved in lab diue top failed HST>
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
## 2017-01-02 DIAGNOSIS — M549 Dorsalgia, unspecified: Secondary | ICD-10-CM | POA: Diagnosis not present

## 2017-01-02 DIAGNOSIS — K219 Gastro-esophageal reflux disease without esophagitis: Secondary | ICD-10-CM | POA: Diagnosis not present

## 2017-01-02 DIAGNOSIS — M542 Cervicalgia: Secondary | ICD-10-CM | POA: Diagnosis not present

## 2017-01-02 NOTE — Telephone Encounter (Signed)
I called pt. I advised pt that Dr. Frances FurbishAthar reviewed pt's sleep study and found that pt did not show any significant osa. Mild intermittent snoring was noted for which Dr. Frances FurbishAthar recommends an oral appliance, which can be made through a qualified dentist, if snoring is disturbing to the pt. There was supine REM mild osa noted without any significant desaturations. Dr. Frances FurbishAthar recommends that pt avoid supine sleep position and recommends that pt pursue weight loss for treatment of the mild supine REM osa. I reviewed sleep hygiene recommendations with the pt, including trying to keep a regular sleep wake schedule, avoiding electronics in the bedroom, keeping the bedroom cool, dark, and quiet, and avoiding eating or exercising within 2 hours of bedtime as well as eating in the middle of the night. I advised pt to keep pets away from the bedtime. I discussed with pt the importance of stress relief and to try meditation, deep breathing exercises, and/or a white noise machine or fan to diffuse other noise distracters. I advised pt to not drink alcohol before bedtime and to never mix alcohol and sedating medications. Pt was advised to avoid narcotic pain medication close to bedtime. I advised pt that a copy of these sleep study results will be sent to Dr. Oswaldo DoneVincent. Pt verbalized understanding of results. Pt had no questions at this time but was encouraged to call back if questions arise.

## 2017-01-14 ENCOUNTER — Encounter: Payer: Self-pay | Admitting: Pediatrics

## 2017-01-14 ENCOUNTER — Ambulatory Visit (INDEPENDENT_AMBULATORY_CARE_PROVIDER_SITE_OTHER): Payer: 59 | Admitting: Pediatrics

## 2017-01-14 VITALS — BP 125/85 | HR 76 | Temp 97.5°F | Ht 63.0 in | Wt 217.0 lb

## 2017-01-14 DIAGNOSIS — J069 Acute upper respiratory infection, unspecified: Secondary | ICD-10-CM | POA: Diagnosis not present

## 2017-01-14 DIAGNOSIS — G43809 Other migraine, not intractable, without status migrainosus: Secondary | ICD-10-CM

## 2017-01-14 DIAGNOSIS — H00014 Hordeolum externum left upper eyelid: Secondary | ICD-10-CM | POA: Diagnosis not present

## 2017-01-14 NOTE — Progress Notes (Signed)
  Subjective:   Patient ID: Christina [name], female    DOB: 02-15-1961, 56 y.o.   MRN: [id number] CC: Headache and Eye Pain (Swelling, matted )  HPI: Christina CaneGracie Dufner is a 56 y.o. female presenting for Headache and Eye Pain (Swelling, matted )  Has been using hot and cold compresses on eyes L eye was slightly matted this morning, hasnt had any further discharge  No fevers Some nasal congestion  Starting two days ago has had pressure behind eyes with her headache, similar to usual migraine Usually the R eye>L eye Usually has headaches similar to this 2-3 times a week Has been tried on multiple preventive medications in the past  Relevant past medical, surgical, family and social history reviewed. Allergies and medications reviewed and updated. Social History   Tobacco Use  Smoking Status Never Smoker  Smokeless Tobacco Never Used   ROS: Per HPI   Objective:    BP 125/85   Pulse 76   Temp (!) 97.5 F (36.4 C) (Oral)   Ht 5\' 3"  (1.6 m)   Wt 217 lb (98.4 kg)   BMI 38.44 kg/m   Wt Readings from Last 3 Encounters:  01/14/17 217 lb (98.4 kg)  12/27/16 215 lb 3.2 oz (97.6 kg)  11/28/16 214 lb 9.6 oz (97.3 kg)    Gen: NAD, alert, cooperative with exam, NCAT EYES: EOMI, no conjunctival injection, L upper lid with slight redness or no icterus ENT:  TMs dull gray b/l, OP with mild erythema LYMPH: no cervical LAD CV: NRRR, normal S1/S2 Resp: CTABL, no wheezes, normal WOB Abd: +BS, soft, NTND. no guarding or organomegaly Ext: No edema, warm Neuro: Alert and oriented, strength equal b/l UE and LE, coordination grossly normal MSK: normal muscle bulk  Assessment & Plan:  Berline LopesGracie was seen today for headache, URI symptoms  Diagnoses and all orders for this visit:  Acute URI Discussed symptom care  Hordeolum externum of left upper eyelid Warm compresses, if not improving, any worsening let me know  Other migraine without status migrainosus, not intractable Headache similar to  usual headache Declines preventive medication at this time  Follow up plan: As scheduled Rex Krasarol Vincent, MD [facility]

## 2017-01-14 NOTE — Patient Instructions (Signed)
Continue cold and warm compresses on eye Any worsening let me know

## 2017-01-17 DIAGNOSIS — H0014 Chalazion left upper eyelid: Secondary | ICD-10-CM | POA: Diagnosis not present

## 2017-02-07 ENCOUNTER — Ambulatory Visit: Payer: 59 | Admitting: Pediatrics

## 2017-02-07 DIAGNOSIS — D72829 Elevated white blood cell count, unspecified: Secondary | ICD-10-CM | POA: Diagnosis not present

## 2017-02-07 DIAGNOSIS — R112 Nausea with vomiting, unspecified: Secondary | ICD-10-CM | POA: Diagnosis not present

## 2017-02-07 DIAGNOSIS — R110 Nausea: Secondary | ICD-10-CM | POA: Diagnosis not present

## 2017-02-07 DIAGNOSIS — R109 Unspecified abdominal pain: Secondary | ICD-10-CM | POA: Diagnosis not present

## 2017-02-07 DIAGNOSIS — R1011 Right upper quadrant pain: Secondary | ICD-10-CM | POA: Diagnosis not present

## 2017-02-07 DIAGNOSIS — R1012 Left upper quadrant pain: Secondary | ICD-10-CM | POA: Diagnosis not present

## 2017-02-07 DIAGNOSIS — R1010 Upper abdominal pain, unspecified: Secondary | ICD-10-CM | POA: Diagnosis not present

## 2017-02-09 ENCOUNTER — Encounter: Payer: Self-pay | Admitting: Pediatrics

## 2017-02-09 ENCOUNTER — Ambulatory Visit (INDEPENDENT_AMBULATORY_CARE_PROVIDER_SITE_OTHER): Payer: 59 | Admitting: Pediatrics

## 2017-02-09 VITALS — BP 106/63 | HR 70 | Temp 98.0°F | Ht 63.0 in | Wt 218.2 lb

## 2017-02-09 DIAGNOSIS — K219 Gastro-esophageal reflux disease without esophagitis: Secondary | ICD-10-CM | POA: Diagnosis not present

## 2017-02-09 DIAGNOSIS — R1013 Epigastric pain: Secondary | ICD-10-CM | POA: Diagnosis not present

## 2017-02-09 MED ORDER — OMEPRAZOLE 40 MG PO CPDR: 40.0000 mg | DELAYED_RELEASE_CAPSULE | Freq: Every day | ORAL | 0 refills | Status: DC
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
## 2017-02-15 ENCOUNTER — Encounter: Payer: Self-pay | Admitting: Family Medicine

## 2017-03-01 ENCOUNTER — Ambulatory Visit (HOSPITAL_COMMUNITY): Admission: RE | Admit: 2017-03-01 | Payer: 59 | Source: Ambulatory Visit

## 2017-03-03 ENCOUNTER — Ambulatory Visit (HOSPITAL_COMMUNITY)
Admission: RE | Admit: 2017-03-03 | Discharge: 2017-03-03 | Disposition: A | Discharge status: Home / Self Care | Payer: 59 | Source: Ambulatory Visit | Attending: Pediatrics | Admitting: Pediatrics

## 2017-03-03 DIAGNOSIS — K824 Cholesterolosis of gallbladder: Secondary | ICD-10-CM | POA: Diagnosis not present

## 2017-03-03 DIAGNOSIS — N281 Cyst of kidney, acquired: Secondary | ICD-10-CM | POA: Insufficient documentation

## 2017-03-03 DIAGNOSIS — R1011 Right upper quadrant pain: Secondary | ICD-10-CM | POA: Insufficient documentation

## 2017-03-03 DIAGNOSIS — R1013 Epigastric pain: Secondary | ICD-10-CM

## 2017-04-12 ENCOUNTER — Encounter: Payer: Self-pay | Admitting: *Deleted

## 2017-04-12 ENCOUNTER — Encounter: Payer: Self-pay | Admitting: Pediatrics

## 2017-04-12 ENCOUNTER — Telehealth: Payer: Self-pay

## 2017-04-12 DIAGNOSIS — K824 Cholesterolosis of gallbladder: Secondary | ICD-10-CM | POA: Insufficient documentation

## 2017-04-12 NOTE — Telephone Encounter (Signed)
We can have her see GI now if she wants. OK to put in referral, RUQ pain.

## 2017-04-12 NOTE — Telephone Encounter (Signed)
Patient returned call and gave results of abdominal US. Advised to repeat in 1 year. Patient states that she is currently not having any pain but she is very concerned that she will have another episode and would like to know what she should do from here. Please advise

## 2017-04-13 NOTE — Telephone Encounter (Signed)
Pt notified of recommendation Pt has seen Dr Caryl AdaHun in [place] Pt will schedule her own appt Verbalizes understanding

## 2017-05-13 ENCOUNTER — Other Ambulatory Visit: Payer: Self-pay | Admitting: Pediatrics

## 2017-05-13 DIAGNOSIS — K219 Gastro-esophageal reflux disease without esophagitis: Secondary | ICD-10-CM

## 2017-05-19 ENCOUNTER — Other Ambulatory Visit: Payer: Self-pay | Admitting: Pediatrics

## 2017-05-19 DIAGNOSIS — Z9289 Personal history of other medical treatment: Secondary | ICD-10-CM

## 2017-05-22 ENCOUNTER — Ambulatory Visit (HOSPITAL_BASED_OUTPATIENT_CLINIC_OR_DEPARTMENT_OTHER)
Admission: RE | Admit: 2017-05-22 | Discharge: 2017-05-22 | Disposition: A | Discharge status: Home / Self Care | Payer: 59 | Source: Ambulatory Visit | Attending: Pediatrics | Admitting: Pediatrics

## 2017-05-22 DIAGNOSIS — Z9289 Personal history of other medical treatment: Secondary | ICD-10-CM

## 2017-05-22 DIAGNOSIS — Z1231 Encounter for screening mammogram for malignant neoplasm of breast: Secondary | ICD-10-CM | POA: Insufficient documentation

## 2017-08-14 ENCOUNTER — Other Ambulatory Visit: Payer: Self-pay | Admitting: Family Medicine

## 2017-08-14 DIAGNOSIS — K219 Gastro-esophageal reflux disease without esophagitis: Secondary | ICD-10-CM

## 2017-08-14 NOTE — Telephone Encounter (Signed)
Last seen 02/09/17  Dr Bradshaw 

## 2017-08-30 ENCOUNTER — Ambulatory Visit (INDEPENDENT_AMBULATORY_CARE_PROVIDER_SITE_OTHER): Payer: 59 | Admitting: Family Medicine

## 2017-08-30 ENCOUNTER — Ambulatory Visit (INDEPENDENT_AMBULATORY_CARE_PROVIDER_SITE_OTHER): Payer: 59

## 2017-08-30 ENCOUNTER — Encounter: Payer: Self-pay | Admitting: Family Medicine

## 2017-08-30 VITALS — BP 127/78 | HR 68 | Temp 97.9°F | Ht 63.0 in | Wt 219.6 lb

## 2017-08-30 DIAGNOSIS — M5442 Lumbago with sciatica, left side: Secondary | ICD-10-CM

## 2017-08-30 DIAGNOSIS — M5441 Lumbago with sciatica, right side: Secondary | ICD-10-CM

## 2017-08-30 DIAGNOSIS — M545 Low back pain: Secondary | ICD-10-CM | POA: Diagnosis not present

## 2017-08-30 DIAGNOSIS — R61 Generalized hyperhidrosis: Secondary | ICD-10-CM | POA: Diagnosis not present

## 2017-08-30 DIAGNOSIS — G8929 Other chronic pain: Secondary | ICD-10-CM

## 2017-08-30 MED ORDER — PREDNISONE 10 MG PO TABS: ORAL_TABLET | ORAL | 0 refills | Status: DC

## 2017-08-30 NOTE — Progress Notes (Signed)
   HPI  Patient presents today for night sweats and low back pain.  Low back pain patient states that she had a accident in November, she was seen after that and had back pain.  She states that she got better for a while and now has had worsening back pain she feels is attributed this accident. She describes midline low back pain with a catch that happens when she bends forward.  The catch is now happening 2-3 times a day and is very severe. She wonders if she has arthritis, she has family history of osteoarthritis in many relatives.  Night sweats 7-8 months of symptoms with multiple episodes of night sweats and sleep disturbance.  Does have some hot flashes in the daytime is also. Describes total hysterectomy in 2013 and states that her ovaries were removed at that time as well.  He does not remember having any hot flashes after surgery.  PMH: Smoking status noted ROS: Per HPI  Objective: BP 127/78   Pulse 68   Temp 97.9 F (36.6 C) (Oral)   Ht '5\' 3"'$  (1.6 m)   Wt 219 lb 9.6 oz (99.6 kg)   BMI 38.90 kg/m  Gen: NAD, alert, cooperative with exam HEENT: NCAT CV: RRR, good S1/S2, no murmur Resp: CTABL, no wheezes, non-labored Ext: No edema, warm Neuro: Alert and oriented, No gross deficits MSK:  Tenderness to palpation of midline lumbar spine around L3/L4. No paraspinal muscle tenderness Negative modified straight leg raise bilaterally, 2+ patellar tendon reflexes bilaterally  Assessment and plan:  #Low back pain Musculoskeletal pain Short course of prednisone given Reports numbness of the legs that extends to the popliteal fossa left greater than right on occasion   #Night sweats Her symptoms are descriptive of menopausal night sweats She believes that her ovaries were removed, however she never experienced an episode of hot flashes typical of surgical menopause Basic labs including thyroid   Orders Placed This Encounter  Procedures  . DG Lumbar Spine 2-3 Views   Standing Status:   Future    Standing Expiration Date:   10/30/2018    Order Specific Question:   Reason for Exam (SYMPTOM  OR DIAGNOSIS REQUIRED)    Answer:   lumbar back pain    Order Specific Question:   Is the patient pregnant?    Answer:   No    Order Specific Question:   Preferred imaging location?    Answer:   Internal  . CBC with Differential/Platelet  . CMP14+EGFR  . Thyroid Panel With TSH    Meds ordered this encounter  Medications  . predniSONE (DELTASONE) 10 MG tablet    Sig: Take 4 pills a day for 3 days, then 3 pills a day for 3 days, then 2 pills a day for 3 days, then 1 pill a day for 3 days, then stop    Dispense:  30 tablet    Refill:  0    Laroy Apple, MD [facility] 08/30/2017, 4:04 PM

## 2017-08-30 NOTE — Patient Instructions (Signed)
Great to see you!  Try prednisone for 12 days, if you are not getting much better in 1 to 2 weeks, please call back and I will refer you to orthopedic surgery We will call within 1 week with x-ray results.  We will also call within 1 week with your lab results.

## 2017-08-31 ENCOUNTER — Other Ambulatory Visit: Payer: Self-pay | Admitting: *Deleted

## 2017-08-31 DIAGNOSIS — R7309 Other abnormal glucose: Secondary | ICD-10-CM

## 2017-09-02 LAB — CBC WITH DIFFERENTIAL/PLATELET
BASOS: 0 %
Basophils Absolute: 0 10*3/uL (ref 0.0–0.2)
EOS (ABSOLUTE): 0.4 10*3/uL (ref 0.0–0.4)
Eos: 5 %
Hematocrit: 33.7 % — ABNORMAL LOW (ref 34.0–46.6)
Hemoglobin: 10.7 g/dL — ABNORMAL LOW (ref 11.1–15.9)
Immature Grans (Abs): 0 10*3/uL (ref 0.0–0.1)
Immature Granulocytes: 0 %
LYMPHS ABS: 2.6 10*3/uL (ref 0.7–3.1)
Lymphs: 34 %
MCH: 25 pg — AB (ref 26.6–33.0)
MCHC: 31.8 g/dL (ref 31.5–35.7)
MCV: 79 fL (ref 79–97)
MONOS ABS: 0.4 10*3/uL (ref 0.1–0.9)
Monocytes: 5 %
NEUTROS ABS: 4.3 10*3/uL (ref 1.4–7.0)
Neutrophils: 56 %
PLATELETS: 274 10*3/uL (ref 150–450)
RBC: 4.28 x10E6/uL (ref 3.77–5.28)
RDW: 16.6 % — AB (ref 12.3–15.4)
WBC: 7.7 10*3/uL (ref 3.4–10.8)

## 2017-09-02 LAB — CMP14+EGFR
A/G RATIO: 1.6 (ref 1.2–2.2)
ALT: 13 IU/L (ref 0–32)
AST: 12 IU/L (ref 0–40)
Albumin: 4.2 g/dL (ref 3.5–5.5)
Alkaline Phosphatase: 134 IU/L — ABNORMAL HIGH (ref 39–117)
BUN/Creatinine Ratio: 18 (ref 9–23)
BUN: 14 mg/dL (ref 6–24)
CHLORIDE: 105 mmol/L (ref 96–106)
CO2: 23 mmol/L (ref 20–29)
Calcium: 9.5 mg/dL (ref 8.7–10.2)
Creatinine, Ser: 0.78 mg/dL (ref 0.57–1.00)
GFR calc non Af Amer: 86 mL/min/{1.73_m2} (ref >59)
GFR, EST AFRICAN AMERICAN: 99 mL/min/{1.73_m2} (ref >59)
GLOBULIN, TOTAL: 2.7 g/dL (ref 1.5–4.5)
Glucose: 118 mg/dL — ABNORMAL HIGH (ref 65–99)
POTASSIUM: 4.6 mmol/L (ref 3.5–5.2)
SODIUM: 143 mmol/L (ref 134–144)
Total Protein: 6.9 g/dL (ref 6.0–8.5)

## 2017-09-02 LAB — THYROID PANEL WITH TSH
Free Thyroxine Index: 1.2 (ref 1.2–4.9)
T3 UPTAKE RATIO: 23 % — AB (ref 24–39)
T4 TOTAL: 5.4 ug/dL (ref 4.5–12.0)
TSH: 2.08 u[IU]/mL (ref 0.450–4.500)

## 2017-09-02 LAB — ANA,IFA RA DIAG PNL W/RFLX TIT/PATN
ANA Titer 1: NEGATIVE
CYCLIC CITRULLIN PEPTIDE AB: 23 U — AB (ref 0–19)
Rhuematoid fact SerPl-aCnc: <10 IU/mL (ref 0.0–13.9)

## 2017-11-16 ENCOUNTER — Other Ambulatory Visit: Payer: Self-pay | Admitting: *Deleted

## 2017-11-16 DIAGNOSIS — K219 Gastro-esophageal reflux disease without esophagitis: Secondary | ICD-10-CM

## 2017-11-16 MED ORDER — OMEPRAZOLE 40 MG PO CPDR: DELAYED_RELEASE_CAPSULE | ORAL | 0 refills | Status: DC

## 2017-12-13 ENCOUNTER — Other Ambulatory Visit: Payer: Self-pay | Admitting: Family

## 2017-12-13 DIAGNOSIS — K219 Gastro-esophageal reflux disease without esophagitis: Secondary | ICD-10-CM

## 2017-12-13 NOTE — Telephone Encounter (Signed)
LM- to schedule apt to get refills

## 2017-12-13 NOTE — Telephone Encounter (Signed)
[facility]. NTBS. 30 days given 11/16/17

## 2017-12-18 ENCOUNTER — Ambulatory Visit (INDEPENDENT_AMBULATORY_CARE_PROVIDER_SITE_OTHER): Payer: 59 | Admitting: Family

## 2017-12-18 ENCOUNTER — Encounter: Payer: Self-pay | Admitting: Family

## 2017-12-18 VITALS — BP 114/70 | HR 94 | Temp 97.1°F | Ht 63.0 in | Wt 220.4 lb

## 2017-12-18 DIAGNOSIS — J4520 Mild intermittent asthma, uncomplicated: Secondary | ICD-10-CM

## 2017-12-18 DIAGNOSIS — R519 Headache, unspecified: Secondary | ICD-10-CM | POA: Insufficient documentation

## 2017-12-18 DIAGNOSIS — G8929 Other chronic pain: Secondary | ICD-10-CM

## 2017-12-18 DIAGNOSIS — Z23 Encounter for immunization: Secondary | ICD-10-CM

## 2017-12-18 DIAGNOSIS — M150 Primary generalized (osteo)arthritis: Secondary | ICD-10-CM

## 2017-12-18 DIAGNOSIS — K219 Gastro-esophageal reflux disease without esophagitis: Secondary | ICD-10-CM | POA: Diagnosis not present

## 2017-12-18 DIAGNOSIS — R51 Headache: Secondary | ICD-10-CM

## 2017-12-18 DIAGNOSIS — M2550 Pain in unspecified joint: Secondary | ICD-10-CM

## 2017-12-18 DIAGNOSIS — M159 Polyosteoarthritis, unspecified: Secondary | ICD-10-CM | POA: Insufficient documentation

## 2017-12-18 MED ORDER — OMEPRAZOLE 40 MG PO CPDR: DELAYED_RELEASE_CAPSULE | ORAL | 0 refills | Status: DC

## 2017-12-18 MED ORDER — ALBUTEROL SULFATE HFA 108 (90 BASE) MCG/ACT IN AERS: 2.0000 | INHALATION_SPRAY | Freq: Four times a day (QID) | RESPIRATORY_TRACT | 5 refills | Status: DC | PRN

## 2017-12-18 MED ORDER — TOPIRAMATE 50 MG PO TABS: 50.0000 mg | ORAL_TABLET | Freq: Two times a day (BID) | ORAL | 2 refills | Status: DC

## 2017-12-18 MED ORDER — MONTELUKAST SODIUM 10 MG PO TABS: 10.0000 mg | ORAL_TABLET | Freq: Every day | ORAL | 3 refills | Status: DC

## 2017-12-18 NOTE — Progress Notes (Signed)
Subjective:    Patient ID: Christina [name], female    DOB: May 04, 1960, 57 y.o.   MRN: [id number]  Chief Complaint  Patient presents with  . Gastroesophageal Reflux  . Joint Pain   Pt presents to the office today for chronic follow up.  Asthma  She complains of cough and wheezing. There is no difficulty breathing or frequent throat clearing. This is a chronic problem. The current episode started more than 1 year ago. The problem occurs intermittently. Associated symptoms include heartburn, rhinorrhea, sneezing and a sore throat. Her past medical history is significant for asthma.  Gastroesophageal Reflux  She complains of coughing, heartburn, nausea, a sore throat and wheezing. This is a chronic problem. The current episode started more than 1 year ago. The problem occurs occasionally. The symptoms are aggravated by medications. Risk factors include obesity. She has tried a PPI for the symptoms. The treatment provided mild relief.  Arthritis  Presents for follow-up visit. She complains of pain and stiffness. The symptoms have been stable. Affected locations include the right MCP and left MCP (back). Her pain is at a severity of 3/10.  Migraine   This is a chronic problem. The current episode started more than 1 year ago. Episode frequency: 4-5 times a week. The problem has been waxing and waning. The pain is located in the temporal region. The pain quality is similar to prior headaches. The quality of the pain is described as pulsating. The pain is at a severity of 9/10. The pain is moderate. Associated symptoms include coughing, nausea, phonophobia, photophobia, rhinorrhea, a sore throat and vomiting. She has tried NSAIDs for the symptoms. The treatment provided mild relief. Her past medical history is significant for migraine headaches and migraines in the family.      Review of Systems  HENT: Positive for rhinorrhea, sneezing and sore throat.   Eyes: Positive for photophobia.  Respiratory:  Positive for cough and wheezing.   Gastrointestinal: Positive for heartburn, nausea and vomiting.  Musculoskeletal: Positive for arthritis and stiffness.  All other systems reviewed and are negative.      Objective:   Physical Exam  Constitutional: She is oriented to person, place, and time. She appears well-developed and well-nourished. No distress.  HENT:  Head: Normocephalic and atraumatic.  Right Ear: External ear normal.  Left Ear: External ear normal.  Mouth/Throat: Oropharynx is clear and moist.  Eyes: Pupils are equal, round, and reactive to light.  Neck: Normal range of motion. Neck supple. No thyromegaly present.  Cardiovascular: Normal rate, regular rhythm, normal heart sounds and intact distal pulses.  No murmur heard. Pulmonary/Chest: Effort normal and breath sounds normal. No respiratory distress. She has no wheezes.  Abdominal: Soft. Bowel sounds are normal. She exhibits no distension. There is no tenderness.  Musculoskeletal: Normal range of motion. She exhibits no edema or tenderness.  Full ROM of hands and back, mild pain in lower lumbar with flexion  Neurological: She is alert and oriented to person, place, and time. She has normal reflexes. No cranial nerve deficit.  Skin: Skin is warm and dry.  Psychiatric: She has a normal mood and affect. Her behavior is normal. Judgment and thought content normal.  Vitals reviewed.   BP 114/70   Pulse 94   Temp (!) 97.1 F (36.2 C) (Oral)   Ht 5' 3"  (1.6 m)   Wt 220 lb 6.4 oz (100 kg)   BMI 39.04 kg/m      Assessment & Plan:  Christina [name] comes  in today with chief complaint of Gastroesophageal Reflux and Joint Pain   Diagnosis and orders addressed:  1. Intermittent asthma without complication, unspecified asthma severity We will start Singulair today Avoid allergens Albuterol as needed - montelukast (SINGULAIR) 10 MG tablet; Take 1 tablet (10 mg total) by mouth at bedtime.  Dispense: 30 tablet; Refill: 3 -  albuterol (PROVENTIL HFA;VENTOLIN HFA) 108 (90 Base) MCG/ACT inhaler; Inhale 2 puffs into the lungs every 6 (six) hours as needed for wheezing or shortness of breath.  Dispense: 18 g; Refill: 5 - CMP14+EGFR - CBC with Differential/Platelet  2. Gastroesophageal reflux disease, esophagitis presence not specified -Diet discussed- Avoid fried, spicy, citrus foods, caffeine and alcohol -Do not eat 2-3 hours before bedtime -Encouraged small frequent meals -Avoid NSAID's - omeprazole (PRILOSEC) 40 MG capsule; TAKE 1 CAPSULE BY MOUTH EVERY DAY (Needs to be seen before next refill)  Dispense: 30 capsule; Refill: 0 - CMP14+EGFR - CBC with Differential/Platelet  3. Morbid obesity ([place]) - CMP14+EGFR - CBC with Differential/Platelet  4. Primary osteoarthritis involving multiple joints - CMP14+EGFR - CBC with Differential/Platelet - ANA,IFA RA Diag Pnl w/rflx Tit/Patn  5. Chronic nonintractable headache, unspecified headache type We will start Topamax 25 mg BID for one week, then increase to 50 mg BID Stress management  Avoid caffeine  Encourage at least 8 hours of sleep - topiramate (TOPAMAX) 50 MG tablet; Take 1 tablet (50 mg total) by mouth 2 (two) times daily.  Dispense: 60 tablet; Refill: 2 - CMP14+EGFR - CBC with Differential/Platelet  6. Generalized joint pain Labs pending Weight loss discussed - ANA,IFA RA Diag Pnl w/rflx Tit/Patn   Labs pending Health Maintenance reviewed Diet and exercise encouraged  Follow up plan: 6 months    Christina Dun, FNP

## 2017-12-18 NOTE — Patient Instructions (Signed)

## 2018-01-10 ENCOUNTER — Other Ambulatory Visit: Payer: Self-pay | Admitting: *Deleted

## 2018-01-10 DIAGNOSIS — G8929 Other chronic pain: Secondary | ICD-10-CM

## 2018-01-10 DIAGNOSIS — R51 Headache: Principal | ICD-10-CM

## 2018-01-10 DIAGNOSIS — K219 Gastro-esophageal reflux disease without esophagitis: Secondary | ICD-10-CM

## 2018-01-10 DIAGNOSIS — J4520 Mild intermittent asthma, uncomplicated: Secondary | ICD-10-CM

## 2018-01-10 MED ORDER — TOPIRAMATE 50 MG PO TABS: 50.0000 mg | ORAL_TABLET | Freq: Two times a day (BID) | ORAL | 1 refills | Status: DC

## 2018-01-10 MED ORDER — OMEPRAZOLE 40 MG PO CPDR: DELAYED_RELEASE_CAPSULE | ORAL | 1 refills | Status: DC

## 2018-01-10 MED ORDER — MONTELUKAST SODIUM 10 MG PO TABS: 10.0000 mg | ORAL_TABLET | Freq: Every day | ORAL | 1 refills | Status: DC

## 2018-01-10 MED ORDER — ALBUTEROL SULFATE HFA 108 (90 BASE) MCG/ACT IN AERS
2.0000 | INHALATION_SPRAY | Freq: Four times a day (QID) | RESPIRATORY_TRACT | 1 refills | Status: DC | PRN
Start: 2018-01-10 — End: 2018-08-23

## 2018-01-16 IMAGING — US US ABDOMEN COMPLETE
1 series · 13 of 25 positions shown · non-contrast
Comparison: None.

CLINICAL DATA: Epigastric pain since November 2016.

EXAM:
ABDOMEN ULTRASOUND COMPLETE

[Series 1: us abdomen complete · 0.25mm/px · 13 of 96 slices shown]
[im 1/96]
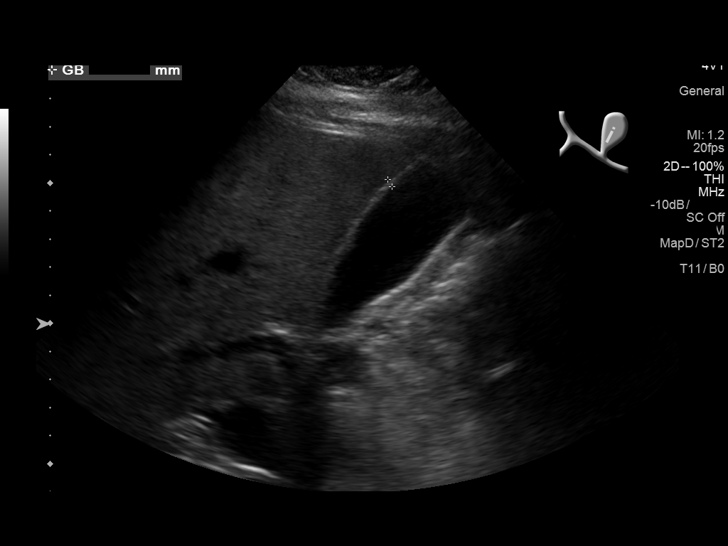
[im 8/96]
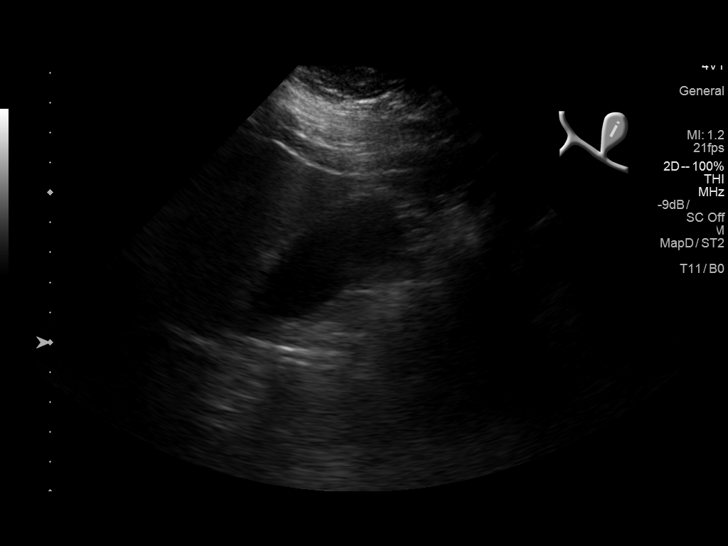
[im 16/96]
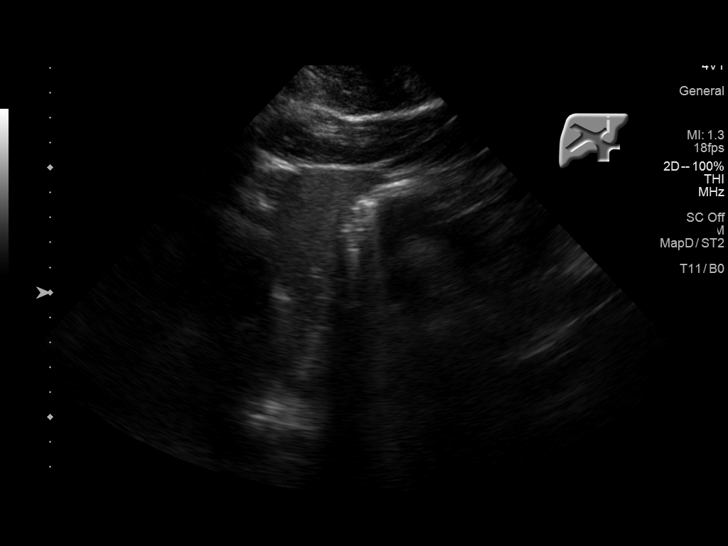
[im 24/96]
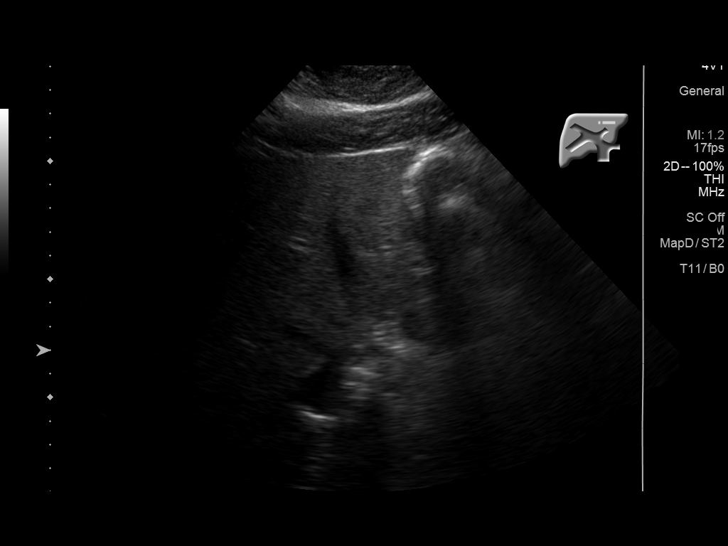
[im 32/96]
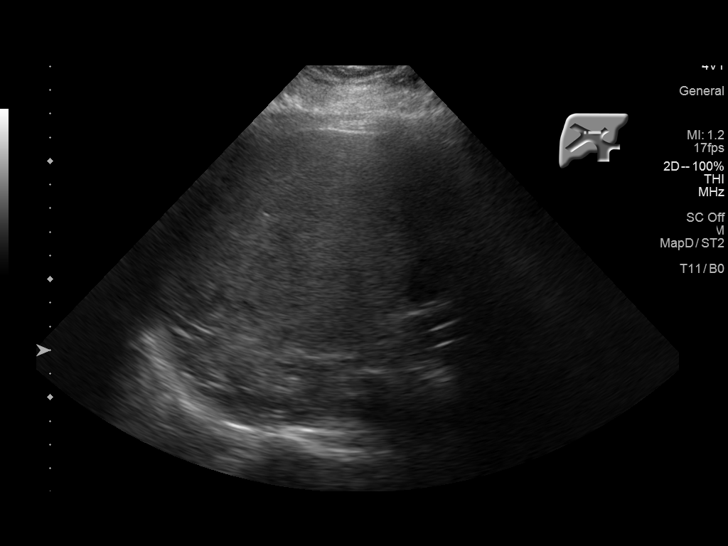
[im 40/96]
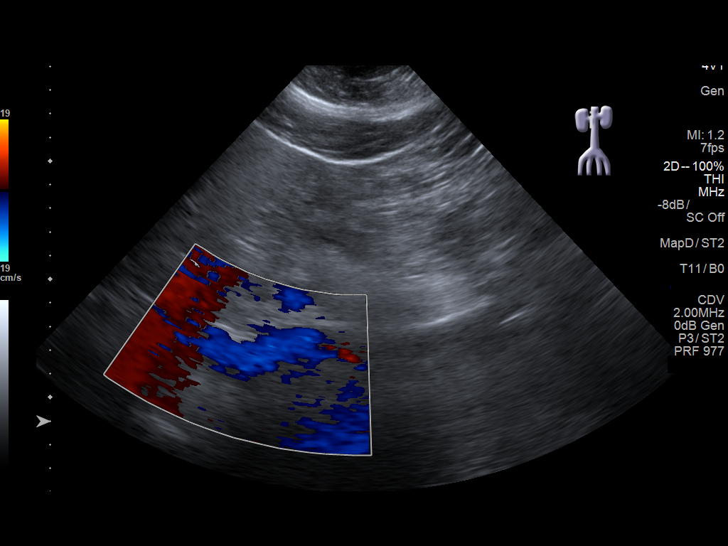
[im 48/96]
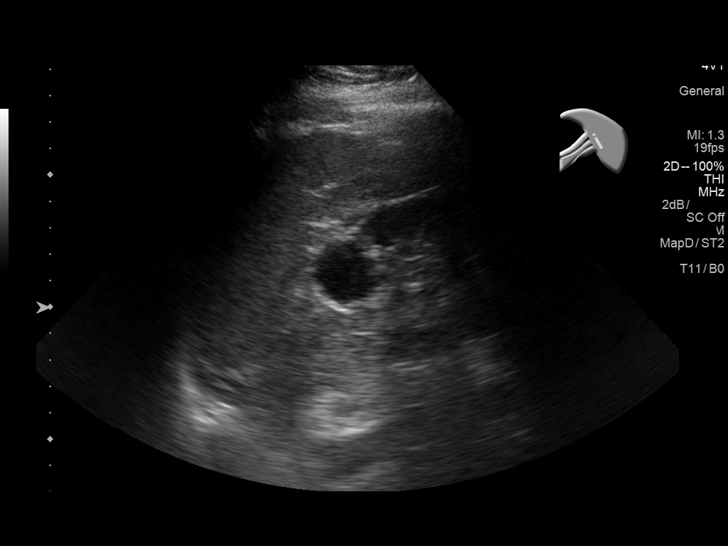
[im 56/96]
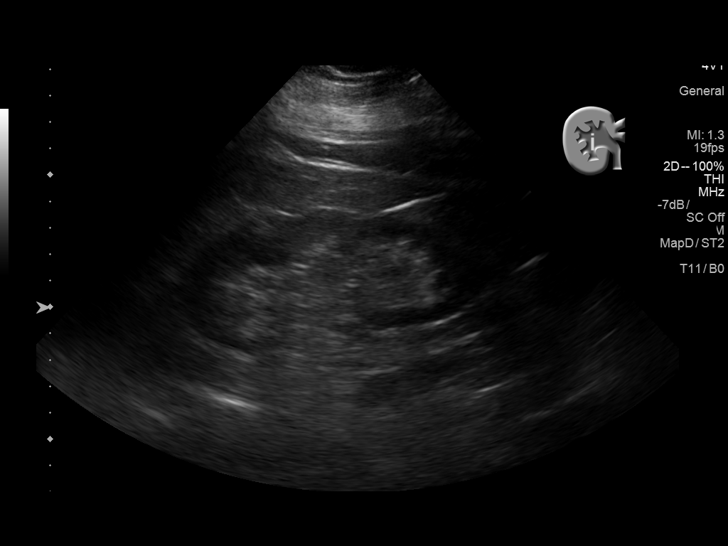
[im 64/96]
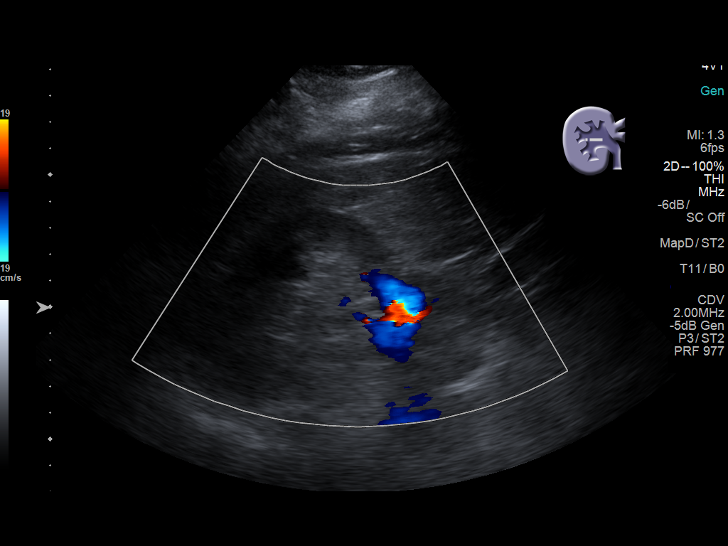
[im 72/96]
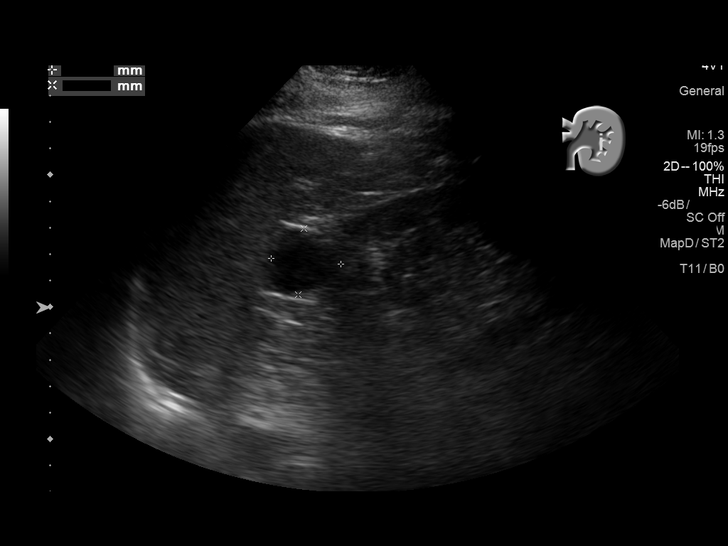
[im 80/96]
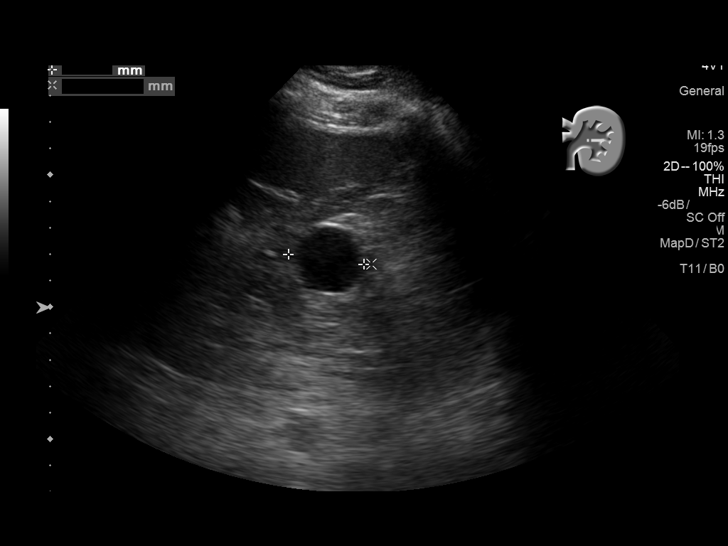
[im 88/96]
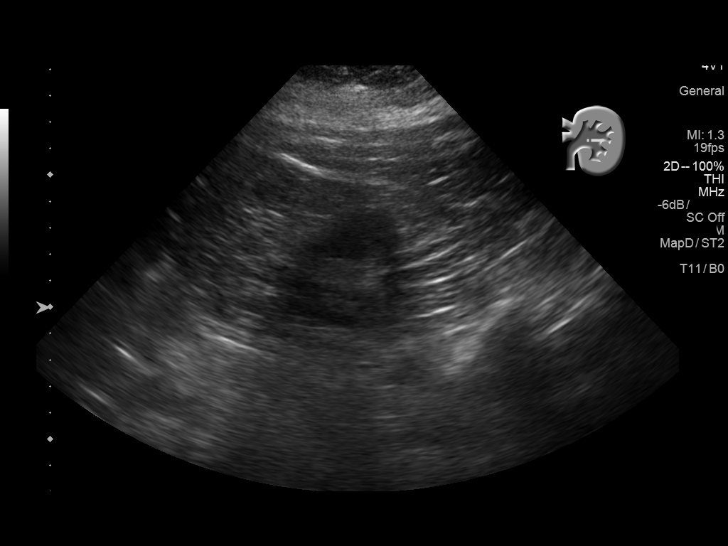
[im 96/96]
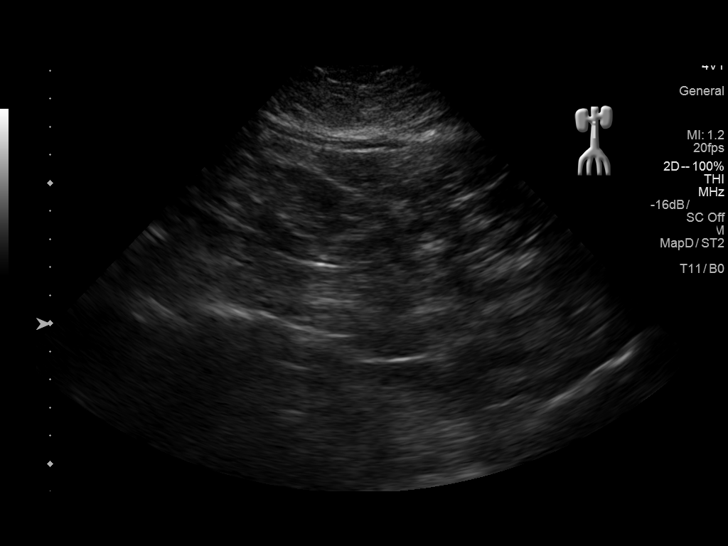

[13 of 25 positions shown; findings below may reference images not displayed]

FINDINGS: Gallbladder: There is a 4 mm polyp in the gallbladder. No stones,
sludge, pericholecystic fluid, or Murphy's sign reported. The
gallbladder wall measures up to 3 mm which is borderline.

Common bile duct: Diameter: 4 mm

Liver: Diffuse increased echogenicity with no focal mass. Portal
vein is patent on color Doppler imaging with normal direction of
blood flow towards the liver.

IVC: No abnormality visualized.

Pancreas: Not well seen due to shadowing bowel gas.

Spleen: Size and appearance within normal limits.

Right Kidney: Length: 12.1 cm.  Increased cortical echogenicity.

Left Kidney: Length: 12.1 cm. Increased cortical echogenicity. There
is a 2.9 cm cyst in the left kidney.

Abdominal aorta: No aneurysm within visualized limits. The
bifurcation is obscured by shadowing bowel gas.

Other findings: None.
IMPRESSION: 1. There is a 4 mm polyp in the gallbladder. Recommend a 1 year
follow-up ultrasound to ensure stability.
2. The gallbladder wall is borderline measuring 3 mm. No stones,
sludge, wall thickening, pericholecystic fluid, or reported Murphy's
sign.
3. Probable hepatic steatosis.
4. Left renal cyst.

## 2018-03-15 ENCOUNTER — Other Ambulatory Visit: Payer: Self-pay | Admitting: Family

## 2018-03-15 DIAGNOSIS — K824 Cholesterolosis of gallbladder: Secondary | ICD-10-CM

## 2018-03-15 NOTE — Progress Notes (Signed)
I have placed an [place] of RUQ to follow up the gallbladder polyp. Marland Kitchen

## 2018-03-15 NOTE — Progress Notes (Signed)
LMTC

## 2018-03-16 NOTE — Progress Notes (Signed)
[facility] for appointment date/time

## 2018-03-20 ENCOUNTER — Ambulatory Visit (HOSPITAL_COMMUNITY)
Admission: RE | Admit: 2018-03-20 | Discharge: 2018-03-20 | Disposition: A | Discharge status: Home / Self Care | Payer: 59 | Source: Ambulatory Visit | Attending: Family | Admitting: Family

## 2018-03-20 DIAGNOSIS — K760 Fatty (change of) liver, not elsewhere classified: Secondary | ICD-10-CM | POA: Diagnosis not present

## 2018-03-20 DIAGNOSIS — K824 Cholesterolosis of gallbladder: Secondary | ICD-10-CM | POA: Diagnosis not present

## 2018-04-13 ENCOUNTER — Telehealth: Payer: Self-pay | Admitting: Family

## 2018-04-13 NOTE — Telephone Encounter (Signed)
Will need to be seen

## 2018-04-13 NOTE — Telephone Encounter (Signed)
Patient aware she will need to be seen. Declines appointment because she is leaving tomorrow morning 04/14/2018

## 2018-05-01 ENCOUNTER — Other Ambulatory Visit: Payer: Self-pay | Admitting: Family

## 2018-05-01 DIAGNOSIS — R51 Headache: Secondary | ICD-10-CM

## 2018-05-01 DIAGNOSIS — J4520 Mild intermittent asthma, uncomplicated: Secondary | ICD-10-CM

## 2018-05-01 DIAGNOSIS — G8929 Other chronic pain: Secondary | ICD-10-CM

## 2018-05-01 DIAGNOSIS — K219 Gastro-esophageal reflux disease without esophagitis: Secondary | ICD-10-CM

## 2018-05-20 DIAGNOSIS — N390 Urinary tract infection, site not specified: Secondary | ICD-10-CM | POA: Diagnosis not present

## 2018-07-02 ENCOUNTER — Telehealth: Payer: Self-pay | Admitting: Family

## 2018-07-11 ENCOUNTER — Other Ambulatory Visit (HOSPITAL_BASED_OUTPATIENT_CLINIC_OR_DEPARTMENT_OTHER): Payer: Self-pay | Admitting: Family Medicine

## 2018-07-11 DIAGNOSIS — Z1231 Encounter for screening mammogram for malignant neoplasm of breast: Secondary | ICD-10-CM

## 2018-07-12 ENCOUNTER — Ambulatory Visit (HOSPITAL_BASED_OUTPATIENT_CLINIC_OR_DEPARTMENT_OTHER)
Admission: RE | Admit: 2018-07-12 | Discharge: 2018-07-12 | Disposition: A | Discharge status: Home / Self Care | Payer: 59 | Source: Ambulatory Visit | Attending: Family Medicine | Admitting: Family Medicine

## 2018-07-12 ENCOUNTER — Other Ambulatory Visit: Payer: Self-pay

## 2018-07-12 DIAGNOSIS — Z1231 Encounter for screening mammogram for malignant neoplasm of breast: Secondary | ICD-10-CM | POA: Diagnosis not present

## 2018-07-17 ENCOUNTER — Other Ambulatory Visit: Payer: Self-pay | Admitting: Family Medicine

## 2018-07-17 DIAGNOSIS — R928 Other abnormal and inconclusive findings on diagnostic imaging of breast: Secondary | ICD-10-CM

## 2018-08-22 ENCOUNTER — Ambulatory Visit: Payer: 59

## 2018-08-22 ENCOUNTER — Other Ambulatory Visit: Payer: Self-pay

## 2018-08-22 ENCOUNTER — Ambulatory Visit
Admission: RE | Admit: 2018-08-22 | Discharge: 2018-08-22 | Disposition: A | Discharge status: Home / Self Care | Payer: 59 | Source: Ambulatory Visit | Attending: Family Medicine | Admitting: Family Medicine

## 2018-08-22 DIAGNOSIS — R928 Other abnormal and inconclusive findings on diagnostic imaging of breast: Secondary | ICD-10-CM

## 2018-08-23 ENCOUNTER — Ambulatory Visit (INDEPENDENT_AMBULATORY_CARE_PROVIDER_SITE_OTHER): Payer: 59 | Admitting: Family Medicine

## 2018-08-23 ENCOUNTER — Encounter: Payer: Self-pay | Admitting: Family Medicine

## 2018-08-23 VITALS — BP 122/64 | HR 61 | Temp 97.1°F | Ht 63.0 in | Wt 217.0 lb

## 2018-08-23 DIAGNOSIS — M2550 Pain in unspecified joint: Secondary | ICD-10-CM

## 2018-08-23 DIAGNOSIS — K219 Gastro-esophageal reflux disease without esophagitis: Secondary | ICD-10-CM

## 2018-08-23 DIAGNOSIS — G43809 Other migraine, not intractable, without status migrainosus: Secondary | ICD-10-CM

## 2018-08-23 DIAGNOSIS — J4530 Mild persistent asthma, uncomplicated: Secondary | ICD-10-CM

## 2018-08-23 DIAGNOSIS — R7309 Other abnormal glucose: Secondary | ICD-10-CM

## 2018-08-23 DIAGNOSIS — M159 Polyosteoarthritis, unspecified: Secondary | ICD-10-CM

## 2018-08-23 DIAGNOSIS — M150 Primary generalized (osteo)arthritis: Secondary | ICD-10-CM

## 2018-08-23 DIAGNOSIS — G43119 Migraine with aura, intractable, without status migrainosus: Secondary | ICD-10-CM | POA: Insufficient documentation

## 2018-08-23 LAB — BAYER DCA HB A1C WAIVED: HB A1C (BAYER DCA - WAIVED): 6.2 % (ref <7.0)

## 2018-08-23 MED ORDER — ALBUTEROL SULFATE HFA 108 (90 BASE) MCG/ACT IN AERS: 2.0000 | INHALATION_SPRAY | Freq: Four times a day (QID) | RESPIRATORY_TRACT | 1 refills | Status: DC | PRN

## 2018-08-23 MED ORDER — BUDESONIDE-FORMOTEROL FUMARATE 80-4.5 MCG/ACT IN AERO: 2.0000 | INHALATION_SPRAY | Freq: Two times a day (BID) | RESPIRATORY_TRACT | 3 refills | Status: DC

## 2018-08-23 MED ORDER — MONTELUKAST SODIUM 10 MG PO TABS: 10.0000 mg | ORAL_TABLET | Freq: Every day | ORAL | 0 refills | Status: DC

## 2018-08-23 MED ORDER — OMEPRAZOLE 40 MG PO CPDR: DELAYED_RELEASE_CAPSULE | ORAL | 0 refills | Status: DC

## 2018-08-23 MED ORDER — TOPIRAMATE 50 MG PO TABS: 50.0000 mg | ORAL_TABLET | Freq: Two times a day (BID) | ORAL | 0 refills | Status: DC

## 2018-08-23 NOTE — Patient Instructions (Signed)
Joint Pain  Joint pain can be caused by many things. It is likely to go away if you follow instructions from your doctor for taking care of yourself at home. Sometimes, you may need more treatment. Follow these instructions at home: Managing pain, stiffness, and swelling   If told, put ice on the painful area. ? Put ice in a plastic bag. ? Place a towel between your skin and the bag. ? Leave the ice on for 20 minutes, 2-3 times a day.  If told, put heat on the painful area. Do this as often as told by your doctor. Use the heat source that your doctor recommends, such as a moist heat pack or a heating pad. ? Place a towel between your skin and the heat source. ? Leave the heat on for 20-30 minutes. ? Take off the heat if your skin gets bright red. This is especially important if you are unable to feel pain, heat, or cold. You may have a greater risk of getting burned.  Move your fingers or toes below the painful joint often. This helps with stiffness and swelling.  If possible, raise (elevate) the painful joint above the level of your heart while you are sitting or lying down. To do this, try putting a few pillows under the painful joint. Activity  Rest the painful joint for as long as told. Do not do things that cause pain or make your pain worse.  Begin exercising or stretching the affected area, as told by your doctor. Ask your doctor what types of exercise are safe for you. If you have an elastic bandage, sling, or splint:  Wear the device as told by your doctor. Take it off only as told by your doctor.  Loosen the device if your fingers or toes below the joint: ? Tingle. ? Lose feeling (get numb). ? Get cold and blue.  Keep the device clean.  Ask your doctor if you should take off the device before bathing. You may need to cover it with a watertight covering when you take a bath or a shower. General instructions  Take over-the-counter and prescription medicines only as told  by your doctor.  Do not use any products that contain nicotine or tobacco. These include cigarettes and e-cigarettes. If you need help quitting, ask your doctor.  Keep all follow-up visits as told by your doctor. This is important. Contact a doctor if:  You have pain that gets worse and does not get better with medicine.  Your joint pain does not get better in 3 days.  You have more bruising or swelling.  You have a fever.  You lose 10 lb (4.5 kg) or more without trying. Get help right away if:  You cannot move the joint.  Your fingers or toes tingle, lose feeling, or get cold and blue.  You have a fever along with a joint that is red, warm, and swollen. Summary  Joint pain can be caused by many things. It often goes away if you follow instructions from your doctor for taking care of yourself at home.  Rest the painful joint for as long as told. Do not do things that cause pain or make your pain worse.  Take over-the-counter and prescription medicines only as told by your doctor. This information is not intended to replace advice given to you by your health care provider. Make sure you discuss any questions you have with your health care provider. Document Released: 01/26/2009 Document Revised: 01/20/2017 Document   Reviewed: 11/23/2016 [employer]vier Patient Education  2020 [employer]  

## 2018-08-23 NOTE — Progress Notes (Signed)
Subjective:  Patient ID: Christina [name], female    DOB: November 09, 1960, 58 y.o.   MRN: [id number]  Chief Complaint:  Medical Management of Chronic Issues (fatigue)   HPI: Christina [name] is a 58 y.o. female presenting on 08/23/2018 for Medical Management of Chronic Issues (fatigue)   1. Other migraine without status migrainosus, not intractable  Pt states she has been doing well on the Topamax. States her headaches are less frequent and less severe in nature. States she does not need abortive medications. States she does get an aura prior to getting the headache. States the headache is right sided and throbbing / pressure like in nature. 5/10 at worst.    2. Gastroesophageal reflux disease without esophagitis  Well controlled with medication. No hemoptysis, sore throat, cough, voice changes, or dysphagia.    3. Mild persistent asthma without complication  Pt travels for work. States when she is in [place] her asthma is not bad. States she has been here she is having more frequent symptoms and having to use her inhaler more often. States the Singulair does not seem to be as effective as it use to be. She has exertional shortness of breath and some nighttime awakenings. No fatigue or lower extremity swelling.    4. Morbid obesity ([place])  Does not watch diet or exercise on a regular basis. Does work long hours and eats fast and easy foods.    5. Primary osteoarthritis involving multiple joints   6. Generalized joint pain  Ongoing pain of multiple joints. Pt had an ANA completed on 08/30/2017 and was to follow up with rheumatology due to an elevated CCP. Pt states she has not followed up with rheumatology. States she is now having stiffness in her fingers and pain in her knees. States this has been ongoing for several years and is slowly getting worse. Pt states she does not like to take medications and has not been taking anything for her joint pain and stiffness. States the pain is tolerable. States  she does work long hours and stiffness is worse at the end of her shift.    7. Elevated glucose  Last glucose was elevated at 118, will recheck today. Pt does not diet or exercise on a regular basis.      Relevant past medical, surgical, family, and social history reviewed and updated as indicated.  Allergies and medications reviewed and updated.   Past Medical History:  Diagnosis Date  . Allergy   . Asthma   . Constipation 10/17/2013  . GERD (gastroesophageal reflux disease)   . Hiatal hernia   . OAB (overactive bladder) 10/17/2013  . Obesity   . Rectocele 10/17/2013  . Sinus infection 11/11/2013  . Urinary incontinence 10/17/2013  . Vertigo 11/11/2013    Past Surgical History:  Procedure Laterality Date  . ABDOMINAL HYSTERECTOMY    . KNEE SURGERY Left     Social History   Socioeconomic History  . Marital status: Married    Spouse name: Not on file  . Number of children: Not on file  . Years of education: Not on file  . Highest education level: Not on file  Occupational History  . Not on file  Social Needs  . Financial resource strain: Not on file  . Food insecurity    Worry: Not on file    Inability: Not on file  . Transportation needs    Medical: Not on file    Non-medical: Not on file  Tobacco Use  .  Smoking status: Never Smoker  . Smokeless tobacco: Never Used  Substance and Sexual Activity  . Alcohol use: No  . Drug use: No  . Sexual activity: Not Currently    Birth control/protection: Surgical  Lifestyle  . Physical activity    Days per week: Not on file    Minutes per session: Not on file  . Stress: Not on file  Relationships  . Social Herbalist on phone: Not on file    Gets together: Not on file    Attends religious service: Not on file    Active member of club or organization: Not on file    Attends meetings of clubs or organizations: Not on file    Relationship status: Not on file  . Intimate partner violence    Fear of current  or ex partner: Not on file    Emotionally abused: Not on file    Physically abused: Not on file    Forced sexual activity: Not on file  Other Topics Concern  . Not on file  Social History Narrative  . Not on file    Outpatient Encounter Medications as of 08/23/2018  Medication Sig  . albuterol (VENTOLIN HFA) 108 (90 Base) MCG/ACT inhaler Inhale 2 puffs into the lungs every 6 (six) hours as needed for wheezing or shortness of breath.  . montelukast (SINGULAIR) 10 MG tablet Take 1 tablet (10 mg total) by mouth at bedtime. (Please make 6 mos April appt)  . omeprazole (PRILOSEC) 40 MG capsule TAKE 1 CAPSULE DAILY (Please make 6 mos April appt)  . topiramate (TOPAMAX) 50 MG tablet Take 1 tablet (50 mg total) by mouth 2 (two) times daily. (Please make 6 mos April appt)  . [DISCONTINUED] albuterol (PROVENTIL HFA;VENTOLIN HFA) 108 (90 Base) MCG/ACT inhaler Inhale 2 puffs into the lungs every 6 (six) hours as needed for wheezing or shortness of breath.  . [DISCONTINUED] montelukast (SINGULAIR) 10 MG tablet Take 1 tablet (10 mg total) by mouth at bedtime. (Please make 6 mos April appt)  . [DISCONTINUED] omeprazole (PRILOSEC) 40 MG capsule TAKE 1 CAPSULE DAILY (Please make 6 mos April appt)  . [DISCONTINUED] topiramate (TOPAMAX) 50 MG tablet Take 1 tablet (50 mg total) by mouth 2 (two) times daily. (Please make 6 mos April appt)  . budesonide-formoterol (SYMBICORT) 80-4.5 MCG/ACT inhaler Inhale 2 puffs into the lungs 2 (two) times daily.   No facility-administered encounter medications on file as of 08/23/2018.     Allergies  Allergen Reactions  . Ivp Dye [Iodinated Diagnostic Agents]     STOP BREATHING  . Other     Smoke- wheezing, migraines  . Shellfish Allergy     Review of Systems  Constitutional: Negative for chills, fatigue and fever.  HENT: Negative for sore throat, trouble swallowing and voice change.   Eyes: Negative for photophobia and visual disturbance.  Respiratory: Positive for  chest tightness, shortness of breath (intermittent) and wheezing. Negative for cough.   Cardiovascular: Negative for chest pain, palpitations and leg swelling.  Gastrointestinal: Negative for abdominal pain, constipation, diarrhea, nausea and vomiting.  Genitourinary: Negative for decreased urine volume and difficulty urinating.  Musculoskeletal: Positive for arthralgias and joint swelling. Negative for gait problem, neck pain and neck stiffness.  Neurological: Positive for headaches. Negative for dizziness, tremors, seizures, syncope, facial asymmetry, speech difficulty, weakness, light-headedness and numbness.  Psychiatric/Behavioral: Negative for confusion and sleep disturbance.  All other systems reviewed and are negative.       Objective:  BP 122/64   Pulse 61   Temp (!) 97.1 F (36.2 C) (Oral)   Ht 5' 3"  (1.6 m)   Wt 217 lb (98.4 kg)   BMI 38.44 kg/m    Wt Readings from Last 3 Encounters:  08/23/18 217 lb (98.4 kg)  12/18/17 220 lb 6.4 oz (100 kg)  08/30/17 219 lb 9.6 oz (99.6 kg)    Physical Exam Vitals signs and nursing note reviewed.  Constitutional:      General: She is not in acute distress.    Appearance: Normal appearance. She is well-developed and well-groomed. She is obese. She is not ill-appearing, toxic-appearing or diaphoretic.  HENT:     Head: Normocephalic and atraumatic.     Jaw: There is normal jaw occlusion.     Right Ear: Hearing normal.     Left Ear: Hearing normal.     Nose: Nose normal.     Mouth/Throat:     Lips: Pink.     Mouth: Mucous membranes are moist.     Pharynx: Oropharynx is clear. Uvula midline.  Eyes:     General: Lids are normal.     Extraocular Movements: Extraocular movements intact.     Conjunctiva/sclera: Conjunctivae normal.     Pupils: Pupils are equal, round, and reactive to light.  Neck:     Musculoskeletal: Normal range of motion and neck supple.     Thyroid: No thyroid mass, thyromegaly or thyroid tenderness.      Vascular: No carotid bruit or JVD.     Trachea: Trachea and phonation normal.  Cardiovascular:     Rate and Rhythm: Normal rate and regular rhythm.     Chest Wall: PMI is not displaced.     Pulses: Normal pulses.     Heart sounds: Normal heart sounds. No murmur. No friction rub. No gallop.   Pulmonary:     Effort: Pulmonary effort is normal. No respiratory distress.     Breath sounds: Examination of the right-lower field reveals wheezing. Examination of the left-lower field reveals wheezing. Wheezing (mild) present.  Abdominal:     General: Bowel sounds are normal. There is no distension or abdominal bruit.     Palpations: Abdomen is soft. There is no hepatomegaly or splenomegaly.     Tenderness: There is no abdominal tenderness. There is no right CVA tenderness or left CVA tenderness.     Hernia: No hernia is present.  Musculoskeletal:     Right wrist: Normal.     Left wrist: Normal.     Right hip: Normal.     Left hip: Normal.     Right knee: She exhibits decreased range of motion and swelling. She exhibits no effusion, no ecchymosis, no deformity, no laceration, no erythema, normal alignment, no LCL laxity and normal patellar mobility. No tenderness found.     Left knee: She exhibits decreased range of motion and swelling. She exhibits no effusion, no ecchymosis, no deformity, no laceration, no erythema, normal alignment, no LCL laxity, normal patellar mobility, no bony tenderness, normal meniscus and no MCL laxity. No tenderness found.     Right hand: She exhibits decreased range of motion (decreased flexion due to stiffness and pain), tenderness and swelling. She exhibits no bony tenderness, normal two-point discrimination, normal capillary refill, no deformity and no laceration. Normal sensation noted. Normal strength noted.     Left hand: She exhibits decreased range of motion (flexion due to stiffness and pain), tenderness and swelling. She exhibits no bony tenderness, normal  two-point  discrimination, normal capillary refill, no deformity and no laceration. Normal sensation noted. Normal strength noted.     Right lower leg: No edema.     Left lower leg: No edema.  Lymphadenopathy:     Cervical: No cervical adenopathy.  Skin:    General: Skin is warm and dry.     Capillary Refill: Capillary refill takes less than 2 seconds.     Coloration: Skin is not cyanotic, jaundiced or pale.     Findings: No rash.  Neurological:     General: No focal deficit present.     Mental Status: She is alert and oriented to person, place, and time.     Cranial Nerves: Cranial nerves are intact.     Sensory: Sensation is intact.     Motor: Motor function is intact.     Coordination: Coordination is intact.     Gait: Gait is intact.     Deep Tendon Reflexes: Reflexes are normal and symmetric.  Psychiatric:        Attention and Perception: Attention and perception normal.        Mood and Affect: Mood and affect normal.        Speech: Speech normal.        Behavior: Behavior normal. Behavior is cooperative.        Thought Content: Thought content normal.        Cognition and Memory: Cognition and memory normal.        Judgment: Judgment normal.     Results for orders placed or performed in visit on 08/30/17  CBC with Differential/Platelet  Result Value Ref Range   WBC 7.7 3.4 - 10.8 x10E3/uL   RBC 4.28 3.77 - 5.28 x10E6/uL   Hemoglobin 10.7 (L) 11.1 - 15.9 g/dL   Hematocrit 33.7 (L) 34.0 - 46.6 %   MCV 79 79 - 97 fL   MCH 25.0 (L) 26.6 - 33.0 pg   MCHC 31.8 31.5 - 35.7 g/dL   RDW 16.6 (H) 12.3 - 15.4 %   Platelets 274 150 - 450 x10E3/uL   Neutrophils 56 Not Estab. %   Lymphs 34 Not Estab. %   Monocytes 5 Not Estab. %   Eos 5 Not Estab. %   Basos 0 Not Estab. %   Neutrophils Absolute 4.3 1.4 - 7.0 x10E3/uL   Lymphocytes Absolute 2.6 0.7 - 3.1 x10E3/uL   Monocytes Absolute 0.4 0.1 - 0.9 x10E3/uL   EOS (ABSOLUTE) 0.4 0.0 - 0.4 x10E3/uL   Basophils Absolute 0.0 0.0 - 0.2  x10E3/uL   Immature Granulocytes 0 Not Estab. %   Immature Grans (Abs) 0.0 0.0 - 0.1 x10E3/uL  CMP14+EGFR  Result Value Ref Range   Glucose 118 (H) 65 - 99 mg/dL   BUN 14 6 - 24 mg/dL   Creatinine, Ser 0.78 0.57 - 1.00 mg/dL   GFR calc non Af Amer 86 >59 mL/min/1.73   GFR calc Af Amer 99 >59 mL/min/1.73   BUN/Creatinine Ratio 18 9 - 23   Sodium 143 134 - 144 mmol/L   Potassium 4.6 3.5 - 5.2 mmol/L   Chloride 105 96 - 106 mmol/L   CO2 23 20 - 29 mmol/L   Calcium 9.5 8.7 - 10.2 mg/dL   Total Protein 6.9 6.0 - 8.5 g/dL   Albumin 4.2 3.5 - 5.5 g/dL   Globulin, Total 2.7 1.5 - 4.5 g/dL   Albumin/Globulin Ratio 1.6 1.2 - 2.2   Bilirubin Total <0.2 0.0 - 1.2 mg/dL  Alkaline Phosphatase 134 (H) 39 - 117 IU/L   AST 12 0 - 40 IU/L   ALT 13 0 - 32 IU/L  Thyroid Panel With TSH  Result Value Ref Range   TSH 2.080 0.450 - 4.500 uIU/mL   T4, Total 5.4 4.5 - 12.0 ug/dL   T3 Uptake Ratio 23 (L) 24 - 39 %   Free Thyroxine Index 1.2 1.2 - 4.9  ANA,IFA RA Diag Pnl w/rflx Tit/Patn  Result Value Ref Range   ANA Titer 1 Negative    Rhuematoid fact SerPl-aCnc <10.0 0.0 - 60.6 IU/mL   Cyclic Citrullin Peptide Ab 23 (H) 0 - 19 units       Pertinent labs & imaging results that were available during my care of the patient were reviewed by me and considered in my medical decision making.  Assessment & Plan:  Christina [name] was seen today for medical management of chronic issues.  Diagnoses and all orders for this visit:  Other migraine without status migrainosus, not intractable Pt states she is doing well on Topamax, taking this nightly. No adverse side effects. Denies need for abortive medications. Continue below.  -     topiramate (TOPAMAX) 50 MG tablet; Take 1 tablet (50 mg total) by mouth 2 (two) times daily. (Please make 6 mos April appt)  Gastroesophageal reflux disease without esophagitis Well controlled. Report any new or worsening symptoms. Medications as prescribed.  -     omeprazole  (PRILOSEC) 40 MG capsule; TAKE 1 CAPSULE DAILY (Please make 6 mos April appt) -     CBC with Differential/Platelet  Mild persistent asthma without complication Due to increasing symptoms and need for albuterol, will add Symbicort. Pt aware to follow up in 4 weeks to reevaluate symptoms. Pt aware to report any new or worsening symptoms.  -     albuterol (VENTOLIN HFA) 108 (90 Base) MCG/ACT inhaler; Inhale 2 puffs into the lungs every 6 (six) hours as needed for wheezing or shortness of breath. -     montelukast (SINGULAIR) 10 MG tablet; Take 1 tablet (10 mg total) by mouth at bedtime. (Please make 6 mos April appt) -     budesonide-formoterol (SYMBICORT) 80-4.5 MCG/ACT inhaler; Inhale 2 puffs into the lungs 2 (two) times daily.  Morbid obesity ([place]) Diet and exercise encouraged. Labs pending.  -     CMP14+EGFR -     Lipid panel -     Thyroid Panel With TSH -     Bayer DCA Hb A1c Waived  Primary osteoarthritis involving multiple joints Generalized joint pain Previous CCP was elevated, pt has not followed up with rheumatology. Will place referral today. Symptomatic care discussed. Report any new or worsening symptoms.  -     CMP14+EGFR -     Ambulatory referral to Rheumatology  Elevated glucose Last glucose was elevated, will check A1C today.  -     Bayer DCA Hb A1c Waived     Continue all other maintenance medications.  Follow up plan: Follow up in 4 weeks for asthma reevaluation. Return in about 3 months (around 11/23/2018), or if symptoms worsen or fail to improve, for CPE, PCP.  Educational handout given for joint pain  The above assessment and management plan was discussed with the patient. The patient verbalized understanding of and has agreed to the management plan. Patient is aware to call the clinic if symptoms persist or worsen. Patient is aware when to return to the clinic for a follow-up visit. Patient educated on when  it is appropriate to go to the emergency department.    Monia Pouch, FNP-C [facility] [phone]

## 2018-08-24 LAB — CBC WITH DIFFERENTIAL/PLATELET
Basophils Absolute: 0 10*3/uL (ref 0.0–0.2)
Basos: 1 %
EOS (ABSOLUTE): 0.3 10*3/uL (ref 0.0–0.4)
Eos: 5 %
Hematocrit: 38.8 % (ref 34.0–46.6)
Hemoglobin: 12.5 g/dL (ref 11.1–15.9)
Immature Grans (Abs): 0 10*3/uL (ref 0.0–0.1)
Immature Granulocytes: 0 %
Lymphocytes Absolute: 1.9 10*3/uL (ref 0.7–3.1)
Lymphs: 29 %
MCH: 26.3 pg — ABNORMAL LOW (ref 26.6–33.0)
MCHC: 32.2 g/dL (ref 31.5–35.7)
MCV: 82 fL (ref 79–97)
Monocytes Absolute: 0.4 10*3/uL (ref 0.1–0.9)
Monocytes: 6 %
Neutrophils Absolute: 3.8 10*3/uL (ref 1.4–7.0)
Neutrophils: 59 %
Platelets: 267 10*3/uL (ref 150–450)
RBC: 4.75 x10E6/uL (ref 3.77–5.28)
RDW: 15.6 % — ABNORMAL HIGH (ref 11.7–15.4)
WBC: 6.5 10*3/uL (ref 3.4–10.8)

## 2018-08-24 LAB — CMP14+EGFR
ALT: 17 IU/L (ref 0–32)
AST: 19 IU/L (ref 0–40)
Albumin/Globulin Ratio: 1.7 (ref 1.2–2.2)
Albumin: 4.4 g/dL (ref 3.8–4.9)
Alkaline Phosphatase: 128 IU/L — ABNORMAL HIGH (ref 39–117)
BUN/Creatinine Ratio: 13 (ref 9–23)
BUN: 11 mg/dL (ref 6–24)
Bilirubin Total: 0.3 mg/dL (ref 0.0–1.2)
CO2: 24 mmol/L (ref 20–29)
Calcium: 9.2 mg/dL (ref 8.7–10.2)
Chloride: 105 mmol/L (ref 96–106)
Creatinine, Ser: 0.84 mg/dL (ref 0.57–1.00)
GFR calc Af Amer: 90 mL/min/{1.73_m2} (ref >59)
GFR calc non Af Amer: 78 mL/min/{1.73_m2} (ref >59)
Globulin, Total: 2.6 g/dL (ref 1.5–4.5)
Glucose: 99 mg/dL (ref 65–99)
Potassium: 5 mmol/L (ref 3.5–5.2)
Sodium: 143 mmol/L (ref 134–144)
Total Protein: 7 g/dL (ref 6.0–8.5)

## 2018-08-24 LAB — LIPID PANEL
Chol/HDL Ratio: 4.3 ratio (ref 0.0–4.4)
Cholesterol, Total: 199 mg/dL (ref 100–199)
HDL: 46 mg/dL (ref >39)
LDL Calculated: 133 mg/dL — ABNORMAL HIGH (ref 0–99)
Triglycerides: 98 mg/dL (ref 0–149)
VLDL Cholesterol Cal: 20 mg/dL (ref 5–40)

## 2018-08-24 LAB — THYROID PANEL WITH TSH
Free Thyroxine Index: 1.4 (ref 1.2–4.9)
T3 Uptake Ratio: 21 % — ABNORMAL LOW (ref 24–39)
T4, Total: 6.5 ug/dL (ref 4.5–12.0)
TSH: 1.44 u[IU]/mL (ref 0.450–4.500)

## 2018-10-29 ENCOUNTER — Other Ambulatory Visit: Payer: Self-pay | Admitting: Family

## 2018-10-29 DIAGNOSIS — K219 Gastro-esophageal reflux disease without esophagitis: Secondary | ICD-10-CM

## 2018-10-29 DIAGNOSIS — J4530 Mild persistent asthma, uncomplicated: Secondary | ICD-10-CM

## 2018-10-29 DIAGNOSIS — G43809 Other migraine, not intractable, without status migrainosus: Secondary | ICD-10-CM

## 2018-11-23 ENCOUNTER — Other Ambulatory Visit: Payer: Self-pay

## 2018-11-26 ENCOUNTER — Other Ambulatory Visit: Payer: Self-pay

## 2018-11-26 ENCOUNTER — Ambulatory Visit: Payer: 59

## 2018-11-26 ENCOUNTER — Ambulatory Visit (INDEPENDENT_AMBULATORY_CARE_PROVIDER_SITE_OTHER): Payer: 59

## 2018-11-26 DIAGNOSIS — Z23 Encounter for immunization: Secondary | ICD-10-CM

## 2019-01-28 ENCOUNTER — Other Ambulatory Visit: Payer: Self-pay | Admitting: Family

## 2019-01-28 DIAGNOSIS — G43809 Other migraine, not intractable, without status migrainosus: Secondary | ICD-10-CM

## 2019-04-28 ENCOUNTER — Other Ambulatory Visit: Payer: Self-pay | Admitting: Family

## 2019-04-28 DIAGNOSIS — K219 Gastro-esophageal reflux disease without esophagitis: Secondary | ICD-10-CM

## 2019-04-28 DIAGNOSIS — J4530 Mild persistent asthma, uncomplicated: Secondary | ICD-10-CM

## 2019-04-29 NOTE — Telephone Encounter (Signed)
TELEVISIT  SCHEDULED FOR 04/30/18

## 2019-04-29 NOTE — Telephone Encounter (Signed)
[place] NT[employer] Last OV 08/2018 Mail order not sent

## 2019-04-30 ENCOUNTER — Encounter: Payer: Self-pay | Admitting: Family

## 2019-04-30 ENCOUNTER — Ambulatory Visit (INDEPENDENT_AMBULATORY_CARE_PROVIDER_SITE_OTHER): Payer: 59 | Admitting: Family

## 2019-04-30 DIAGNOSIS — J4530 Mild persistent asthma, uncomplicated: Secondary | ICD-10-CM

## 2019-04-30 DIAGNOSIS — G8929 Other chronic pain: Secondary | ICD-10-CM

## 2019-04-30 DIAGNOSIS — J301 Allergic rhinitis due to pollen: Secondary | ICD-10-CM | POA: Diagnosis not present

## 2019-04-30 DIAGNOSIS — K219 Gastro-esophageal reflux disease without esophagitis: Secondary | ICD-10-CM

## 2019-04-30 DIAGNOSIS — K5900 Constipation, unspecified: Secondary | ICD-10-CM

## 2019-04-30 DIAGNOSIS — R519 Headache, unspecified: Secondary | ICD-10-CM

## 2019-04-30 DIAGNOSIS — F411 Generalized anxiety disorder: Secondary | ICD-10-CM

## 2019-04-30 MED ORDER — OMEPRAZOLE 40 MG PO CPDR: DELAYED_RELEASE_CAPSULE | ORAL | 1 refills | Status: DC

## 2019-04-30 MED ORDER — TOPIRAMATE 100 MG PO TABS: 100.0000 mg | ORAL_TABLET | Freq: Two times a day (BID) | ORAL | 1 refills | Status: DC

## 2019-04-30 MED ORDER — BUDESONIDE-FORMOTEROL FUMARATE 80-4.5 MCG/ACT IN AERO: 2.0000 | INHALATION_SPRAY | Freq: Two times a day (BID) | RESPIRATORY_TRACT | 3 refills | Status: DC

## 2019-04-30 MED ORDER — ESCITALOPRAM OXALATE 5 MG PO TABS: ORAL_TABLET | ORAL | 0 refills | Status: DC

## 2019-04-30 MED ORDER — MONTELUKAST SODIUM 10 MG PO TABS: 10.0000 mg | ORAL_TABLET | Freq: Every day | ORAL | 1 refills | Status: DC
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
## 2019-05-06 ENCOUNTER — Telehealth: Payer: Self-pay | Admitting: *Deleted

## 2019-05-06 MED ORDER — ESCITALOPRAM OXALATE 10 MG PO TABS: 10.0000 mg | ORAL_TABLET | Freq: Every day | ORAL | 0 refills | Status: DC

## 2019-05-06 NOTE — Telephone Encounter (Signed)
Pharmacy called and states insurance will not pay for escitalopram 5mg  2 a day. Patient picked up the 7 day supply of the one a day. Rx sent over for escitalopram 10 mg one daily for the next 21 day.

## 2019-05-07 ENCOUNTER — Ambulatory Visit: Payer: 59 | Admitting: Family

## 2019-05-28 ENCOUNTER — Ambulatory Visit (INDEPENDENT_AMBULATORY_CARE_PROVIDER_SITE_OTHER): Payer: 59 | Admitting: Family

## 2019-05-28 ENCOUNTER — Encounter: Payer: Self-pay | Admitting: Family

## 2019-05-28 DIAGNOSIS — R519 Headache, unspecified: Secondary | ICD-10-CM

## 2019-05-28 DIAGNOSIS — F411 Generalized anxiety disorder: Secondary | ICD-10-CM

## 2019-05-28 DIAGNOSIS — G8929 Other chronic pain: Secondary | ICD-10-CM | POA: Diagnosis not present

## 2019-05-28 NOTE — Progress Notes (Signed)
Virtual Visit via telephone Note Due to COVID-19 pandemic this visit was conducted virtually. This visit type was conducted due to national recommendations for restrictions regarding the COVID-19 Pandemic (e.g. social distancing, sheltering in place) in an effort to limit this patient's exposure and mitigate transmission in our community. All issues noted in this document were discussed and addressed.  A physical exam was not performed with this format.  I connected with Christina [name] on 05/28/19 at 9:37 AM by telephone and verified that I am speaking with the correct person using two identifiers. Christina [name] is currently located at work and no one is currently with her during visit. The provider, Evelina Dun, FNP is located in their office at time of visit.  I discussed the limitations, risks, security and privacy concerns of performing an evaluation and management service by telephone and the availability of in person appointments. I also discussed with the patient that there may be a patient responsible charge related to this service. The patient expressed understanding and agreed to proceed.   History and Present Illness:  Pt calls the office today to recheck GAD and headaches. She reports she had a GI bug over the last two weeks and has not taken any of her medications. She reports she has had to get IV fluids 3 different times over the last two weeks. She was negative for COVID and Flu.  Anxiety Presents for follow-up visit. Symptoms include depressed mood, excessive worry, irritability, nausea, nervous/anxious behavior and restlessness. The severity of symptoms is moderate.    Headache  This is a chronic problem. The current episode started more than 1 year ago. The problem occurs intermittently. The problem has been waxing and waning. The pain is located in the right unilateral region. The quality of the pain is described as sharp. The pain is at a severity of 8/10. The pain is  moderate. Associated symptoms include nausea, phonophobia and photophobia. She has tried beta blockers for the symptoms. The treatment provided mild relief. Her past medical history is significant for migraines in the family.      Review of Systems  Constitutional: Positive for irritability.  Eyes: Positive for photophobia.  Gastrointestinal: Positive for nausea.  Neurological: Positive for headaches.  Psychiatric/Behavioral: The patient is nervous/anxious.   All other systems reviewed and are negative.    Observations/Objective: No SOB or distress noted   Assessment and Plan: Christina [name] comes in today with chief complaint of No chief complaint on file.   Diagnosis and orders addressed:  1. Chronic nonintractable headache, unspecified headache type  2. GAD (generalized anxiety disorder)  PT will restart Lexapro and Topmax  Stress management  We will follow back up in 6 weeks to see how the medications are working, since she has been without over the last 2 weeks      I discussed the assessment and treatment plan with the patient. The patient was provided an opportunity to ask questions and all were answered. The patient agreed with the plan and demonstrated an understanding of the instructions.   The patient was advised to call back or seek an in-person evaluation if the symptoms worsen or if the condition fails to improve as anticipated.  The above assessment and management plan was discussed with the patient. The patient verbalized understanding of and has agreed to the management plan. Patient is aware to call the clinic if symptoms persist or worsen. Patient is aware when to return to the clinic for a follow-up visit. Patient  educated on when it is appropriate to go to the emergency department.   Time call ended:  9:48 AM  I provided 11 minutes of non-face-to-face time during this encounter.    Jannifer Rodney, FNP

## 2019-07-27 ENCOUNTER — Other Ambulatory Visit: Payer: Self-pay | Admitting: Family

## 2019-07-27 DIAGNOSIS — G43809 Other migraine, not intractable, without status migrainosus: Secondary | ICD-10-CM

## 2019-11-07 LAB — HM DIABETES EYE EXAM

## 2019-11-11 ENCOUNTER — Other Ambulatory Visit (HOSPITAL_BASED_OUTPATIENT_CLINIC_OR_DEPARTMENT_OTHER): Payer: Self-pay | Admitting: Family

## 2019-11-11 DIAGNOSIS — Z1231 Encounter for screening mammogram for malignant neoplasm of breast: Secondary | ICD-10-CM

## 2019-11-12 ENCOUNTER — Ambulatory Visit (HOSPITAL_BASED_OUTPATIENT_CLINIC_OR_DEPARTMENT_OTHER): Payer: 59

## 2019-11-13 ENCOUNTER — Other Ambulatory Visit: Payer: Self-pay

## 2019-11-13 ENCOUNTER — Ambulatory Visit (HOSPITAL_BASED_OUTPATIENT_CLINIC_OR_DEPARTMENT_OTHER)
Admission: RE | Admit: 2019-11-13 | Discharge: 2019-11-13 | Disposition: A | Discharge status: Home / Self Care | Payer: 59 | Source: Ambulatory Visit | Attending: Family | Admitting: Family

## 2019-11-13 DIAGNOSIS — Z1231 Encounter for screening mammogram for malignant neoplasm of breast: Secondary | ICD-10-CM | POA: Diagnosis not present

## 2020-02-13 DIAGNOSIS — N10 Acute tubulo-interstitial nephritis: Secondary | ICD-10-CM | POA: Insufficient documentation

## 2020-05-29 ENCOUNTER — Ambulatory Visit: Payer: 59 | Admitting: Family Medicine

## 2020-06-02 ENCOUNTER — Other Ambulatory Visit: Payer: Self-pay

## 2020-06-02 ENCOUNTER — Ambulatory Visit (INDEPENDENT_AMBULATORY_CARE_PROVIDER_SITE_OTHER): Payer: 59 | Admitting: Family Medicine

## 2020-06-02 ENCOUNTER — Encounter: Payer: Self-pay | Admitting: Family Medicine

## 2020-06-02 VITALS — BP 107/59 | HR 90 | Ht 63.0 in | Wt 239.0 lb

## 2020-06-02 DIAGNOSIS — R10A0 Flank pain, unspecified side: Secondary | ICD-10-CM

## 2020-06-02 DIAGNOSIS — R109 Unspecified abdominal pain: Secondary | ICD-10-CM

## 2020-06-02 DIAGNOSIS — F411 Generalized anxiety disorder: Secondary | ICD-10-CM

## 2020-06-02 LAB — URINALYSIS, COMPLETE
Bilirubin, UA: NEGATIVE
Glucose, UA: NEGATIVE
Leukocytes,UA: NEGATIVE
Nitrite, UA: NEGATIVE
Protein,UA: NEGATIVE
RBC, UA: NEGATIVE
Specific Gravity, UA: >1.03 — ABNORMAL HIGH (ref 1.005–1.030)
Urobilinogen, Ur: 0.2 mg/dL (ref 0.2–1.0)
pH, UA: 5 (ref 5.0–7.5)

## 2020-06-02 LAB — MICROSCOPIC EXAMINATION
RBC, Urine: NONE SEEN /hpf (ref 0–2)
WBC, UA: NONE SEEN /hpf (ref 0–5)

## 2020-06-02 MED ORDER — BUSPIRONE HCL 5 MG PO TABS: 5.0000 mg | ORAL_TABLET | Freq: Two times a day (BID) | ORAL | 0 refills | Status: DC

## 2020-06-02 NOTE — Progress Notes (Signed)
Acute Office Visit  Subjective:    Patient ID: Christina [name], female    DOB: [birth year], 60 y.o.   MRN: [id number]  Chief Complaint  Patient presents with  . Flank Pain    bilateral    HPI Patient is in today for bilateral lower back pain x 5 days. The pain comes and goes. The pain feels like a stitch in her side. The pain is a 3/10. She also feel nauseous. She also some dysuria. She denies hematuria, fever, or chills. Denies vomiting, diarrhea, or abdominal pain. She has not taken anything for the pain. She has had kidney stones before and this feels similar. She is always on the road for work.   She also has a history of GAD. She reports having panic attacks when driving. It has been bad enough that she has stopped driving. She has been prescribed lexapro but she does not want to take something every day. She would like something that she can just take as needed.   Past Medical History:  Diagnosis Date  . Allergy   . Asthma   . Constipation 10/17/2013  . GERD (gastroesophageal reflux disease)   . Hiatal hernia   . OAB (overactive bladder) 10/17/2013  . Obesity   . Rectocele 10/17/2013  . Sinus infection 11/11/2013  . Urinary incontinence 10/17/2013  . Vertigo 11/11/2013    Past Surgical History:  Procedure Laterality Date  . ABDOMINAL HYSTERECTOMY    . KNEE SURGERY Left     Family History  Problem Relation Age of Onset  . Cancer Mother        skin  . Arthritis Mother   . Other Mother        chronic diarrhea  . Leukemia Father   . Cancer Father        lung  . Other Father        polio as a child  . Stroke Father   . Thyroid disease Father   . Cancer Sister        breast  . Bipolar disorder Brother   . Heart attack Maternal Grandmother   . Macular degeneration Maternal Grandmother   . Other Maternal Grandfather        migraines  . Stroke Maternal Grandfather   . Hypertension Paternal Grandmother   . Alzheimer's disease Paternal Grandfather   . Thyroid disease  Sister   . Alcohol abuse Brother     Social History   Socioeconomic History  . Marital status: Married    Spouse name: Not on file  . Number of children: Not on file  . Years of education: Not on file  . Highest education level: Not on file  Occupational History  . Not on file  Tobacco Use  . Smoking status: Never Smoker  . Smokeless tobacco: Never Used  Substance and Sexual Activity  . Alcohol use: No  . Drug use: No  . Sexual activity: Not Currently    Birth control/protection: Surgical  Other Topics Concern  . Not on file  Social History Narrative  . Not on file   Social Determinants of Health   Financial Resource Strain: Not on file  Food Insecurity: Not on file  Transportation Needs: Not on file  Physical Activity: Not on file  Stress: Not on file  Social Connections: Not on file  Intimate Partner Violence: Not on file    Outpatient Medications Prior to Visit  Medication Sig Dispense Refill  . albuterol (VENTOLIN HFA) 108 (90  Base) MCG/ACT inhaler Inhale 2 puffs into the lungs every 6 (six) hours as needed for wheezing or shortness of breath. 54 g 1  . atorvastatin (LIPITOR) 10 MG tablet Take 1 tablet by mouth daily.    . budesonide-formoterol (SYMBICORT) 80-4.5 MCG/ACT inhaler Inhale 2 puffs into the lungs 2 (two) times daily. 1 Inhaler 3  . escitalopram (LEXAPRO) 10 MG tablet Take 1 tablet (10 mg total) by mouth daily. 30 tablet 0  . FERROUS SULFATE ER PO Take 1 tablet by mouth daily.    . metFORMIN (GLUCOPHAGE-XR) 500 MG 24 hr tablet Take 500 mg by mouth daily.    . montelukast (SINGULAIR) 10 MG tablet Take 1 tablet (10 mg total) by mouth at bedtime. 90 tablet 1  . omeprazole (PRILOSEC) 40 MG capsule TAKE 1 CAPSULE DAILY 90 capsule 1  . topiramate (TOPAMAX) 100 MG tablet Take 1 tablet (100 mg total) by mouth 2 (two) times daily. 180 tablet 1  . escitalopram (LEXAPRO) 5 MG tablet Take 1 tablet (5 mg total) by mouth daily for 7 days, THEN 2 tablets (10 mg total)  daily for 21 days. 49 tablet 0   No facility-administered medications prior to visit.    Allergies  Allergen Reactions  . Ivp Dye [Iodinated Diagnostic Agents]     STOP BREATHING  . Other     Smoke- wheezing, migraines  . Shellfish Allergy     Review of Systems As per HPI.     Objective:    Physical Exam Vitals and nursing note reviewed.  Constitutional:      General: She is not in acute distress.    Appearance: Normal appearance. She is not ill-appearing.  Cardiovascular:     Rate and Rhythm: Normal rate and regular rhythm.     Pulses: Normal pulses.     Heart sounds: Normal heart sounds. No murmur heard.   Pulmonary:     Effort: Pulmonary effort is normal. No respiratory distress.     Breath sounds: Normal breath sounds.  Abdominal:     General: Bowel sounds are normal. There is no distension.     Palpations: Abdomen is soft. There is no mass.     Tenderness: There is no abdominal tenderness. There is no right CVA tenderness, left CVA tenderness, guarding or rebound.  Musculoskeletal:     Cervical back: Neck supple. No tenderness.     Right lower leg: No edema.     Left lower leg: No edema.  Lymphadenopathy:     Cervical: No cervical adenopathy.  Skin:    General: Skin is warm and dry.  Neurological:     General: No focal deficit present.     Mental Status: She is alert and oriented to person, place, and time.  Psychiatric:        Mood and Affect: Mood normal.        Behavior: Behavior normal.     BP (!) 107/59   Pulse 90   Ht 5\' 3"  (1.6 m)   Wt 239 lb (108.4 kg)   SpO2 96%   BMI 42.34 kg/m  Wt Readings from Last 3 Encounters:  06/02/20 239 lb (108.4 kg)  08/23/18 217 lb (98.4 kg)  12/18/17 220 lb 6.4 oz (100 kg)   Urine dipstick shows positive for ketones.  Micro exam: 0 WBC's per HPF, 0 RBC's per HPF and few+ bacteria.   Health Maintenance Due  Topic Date Due  . COVID-19 Vaccine (1) Never done  . TETANUS/TDAP  Never done  . COLONOSCOPY (Pts  45-60yrs Insurance coverage will need to be confirmed)  07/20/2016    There are no preventive care reminders to display for this patient.   Lab Results  Component Value Date   TSH 1.440 08/23/2018   Lab Results  Component Value Date   WBC 6.5 08/23/2018   HGB 12.5 08/23/2018   HCT 38.8 08/23/2018   MCV 82 08/23/2018   PLT 267 08/23/2018   Lab Results  Component Value Date   NA 143 08/23/2018   K 5.0 08/23/2018   CO2 24 08/23/2018   GLUCOSE 99 08/23/2018   BUN 11 08/23/2018   CREATININE 0.84 08/23/2018   BILITOT 0.3 08/23/2018   ALKPHOS 128 (H) 08/23/2018   AST 19 08/23/2018   ALT 17 08/23/2018   PROT 7.0 08/23/2018   ALBUMIN 4.4 08/23/2018   CALCIUM 9.2 08/23/2018   Lab Results  Component Value Date   CHOL 199 08/23/2018   Lab Results  Component Value Date   HDL 46 08/23/2018   Lab Results  Component Value Date   LDLCALC 133 (H) 08/23/2018   Lab Results  Component Value Date   TRIG 98 08/23/2018   Lab Results  Component Value Date   CHOLHDL 4.3 08/23/2018   Lab Results  Component Value Date   HGBA1C 6.2 08/23/2018       Assessment & Plan:   Christina [name] was seen today for flank pain.  Diagnoses and all orders for this visit:  Flank pain UA negative, culture pending. Benign exam today. Discussed MSK etiology. Tylenol, heat, ice for pain. Return to office for new or worsening symptoms, or if symptoms persist.  -     Urine Culture -     Urinalysis, Complete  GAD (generalized anxiety disorder) Has not been taking lexapro, does not want to take a daily medication. Try buspar BID prn.  -     busPIRone (BUSPAR) 5 MG tablet; Take 1 tablet (5 mg total) by mouth 2 (two) times daily.  The patient indicates understanding of these issues and agrees with the plan.  Gabriel Earing, FNP

## 2020-06-02 NOTE — Patient Instructions (Addendum)
Acute Back Pain, Adult Acute back pain is sudden and usually short-lived. It is often caused by an injury to the muscles and tissues in the back. The injury may result from:  A muscle or ligament getting overstretched or torn (strained). Ligaments are tissues that connect bones to each other. Lifting something improperly can cause a back strain.  Wear and tear (degeneration) of the spinal disks. Spinal disks are circular tissue that provide cushioning between the bones of the spine (vertebrae).  Twisting motions, such as while playing sports or doing yard work.  A hit to the back.  Arthritis. You may have a physical exam, lab tests, and imaging tests to find the cause of your pain. Acute back pain usually goes away with rest and home care. Follow these instructions at home: Managing pain, stiffness, and swelling  Treatment may include medicines for pain and inflammation that are taken by mouth or applied to the skin, prescription pain medicine, or muscle relaxants. Take over-the-counter and prescription medicines only as told by your health care provider.  Your health care provider may recommend applying ice during the first 24-48 hours after your pain starts. To do this: ? Put ice in a plastic bag. ? Place a towel between your skin and the bag. ? Leave the ice on for 20 minutes, 2-3 times a day.  If directed, apply heat to the affected area as often as told by your health care provider. Use the heat source that your health care provider recommends, such as a moist heat pack or a heating pad. ? Place a towel between your skin and the heat source. ? Leave the heat on for 20-30 minutes. ? Remove the heat if your skin turns bright red. This is especially important if you are unable to feel pain, heat, or cold. You have a greater risk of getting burned. Activity  Do not stay in bed. Staying in bed for more than 1-2 days can delay your recovery.  Sit up and stand up straight. Avoid leaning  forward when you sit or hunching over when you stand. ? If you work at a desk, sit close to it so you do not need to lean over. Keep your chin tucked in. Keep your neck drawn back, and keep your elbows bent at a 90-degree angle (right angle). ? Sit high and close to the steering wheel when you drive. Add lower back (lumbar) support to your car seat, if needed.  Take short walks on even surfaces as soon as you are able. Try to increase the length of time you walk each day.  Do not sit, drive, or stand in one place for more than 30 minutes at a time. Sitting or standing for long periods of time can put stress on your back.  Do not drive or use heavy machinery while taking prescription pain medicine.  Use proper lifting techniques. When you bend and lift, use positions that put less stress on your back: ? [place] your knees. ? Keep the load close to your body. ? Avoid twisting.  Exercise regularly as told by your health care provider. Exercising helps your back heal faster and helps prevent back injuries by keeping muscles strong and flexible. Work with a physical therapist to make a safe exercise program, as recommended by your health care provider. Do any exercises as told by your physical t Managing Anxiety, Adult After being diagnosed with an anxiety disorder, you may be relieved to know why you have felt or behaved  a certain way. You may also feel overwhelmed about the treatment ahead and what it will mean for your life. With care and support, you can manage this condition and recover from it. How to manage lifestyle changes Managing stress and anxiety Stress is your body's reaction to life changes and events, both good and bad. Most stress will last just a few hours, but stress can be ongoing and can lead to more than just stress. Although stress can play a major role in anxiety, it is not the same as anxiety. Stress is usually caused by something external, such as a deadline, test, or  competition. Stress normally passes after the triggering event has ended.  Anxiety is caused by something internal, such as imagining a terrible outcome or worrying that something will go wrong that will devastate you. Anxiety often does not go away even after the triggering event is over, and it can become long-term (chronic) worry. It is important to understand the differences between stress and anxiety and to manage your stress effectively so that it does not lead to an anxious response. Talk with your health care provider or a counselor to learn more about reducing anxiety and stress. He or she may suggest tension reduction techniques, such as: Music therapy. This can include creating or listening to music that you enjoy and that inspires you. Mindfulness-based meditation. This involves being aware of your normal breaths while not trying to control your breathing. It can be done while sitting or walking. Centering prayer. This involves focusing on a word, phrase, or sacred image that means something to you and brings you peace. Deep breathing. To do this, expand your stomach and inhale slowly through your nose. Hold your breath for 3-5 seconds. Then exhale slowly, letting your stomach muscles relax. Self-talk. This involves identifying thought patterns that lead to anxiety reactions and changing those patterns. Muscle relaxation. This involves tensing muscles and then relaxing them. Choose a tension reduction technique that suits your lifestyle and personality. These techniques take time and practice. Set aside 5-15 minutes a day to do them. Therapists can offer counseling and training in these techniques. The training to help with anxiety may be covered by some insurance plans. Other things you can do to manage stress and anxiety include: Keeping a stress/anxiety diary. This can help you learn what triggers your reaction and then learn ways to manage your response. Thinking about how you react to  certain situations. You may not be able to control everything, but you can control your response. Making time for activities that help you relax and not feeling guilty about spending your time in this way. Visual imagery and yoga can help you stay calm and relax.   Medicines Medicines can help ease symptoms. Medicines for anxiety include: Anti-anxiety drugs. Antidepressants. Medicines are often used as a primary treatment for anxiety disorder. Medicines will be prescribed by a health care provider. When used together, medicines, psychotherapy, and tension reduction techniques may be the most effective treatment. Relationships Relationships can play a big part in helping you recover. Try to spend more time connecting with trusted friends and family members. Consider going to couples counseling, taking family education classes, or going to family therapy. Therapy can help you and others better understand your condition. How to recognize changes in your anxiety Everyone responds differently to treatment for anxiety. Recovery from anxiety happens when symptoms decrease and stop interfering with your daily activities at home or work. This may mean that you will start  to: Have better concentration and focus. Worry will interfere less in your daily thinking. Sleep better. Be less irritable. Have more energy. Have improved memory. It is important to recognize when your condition is getting worse. Contact your health care provider if your symptoms interfere with home or work and you feel like your condition is not improving. Follow these instructions at home: Activity Exercise. Most adults should do the following: Exercise for at least 150 minutes each week. The exercise should increase your heart rate and make you sweat (moderate-intensity exercise). Strengthening exercises at least twice a week. Get the right amount and quality of sleep. Most adults need 7-9 hours of sleep each night. Lifestyle Eat  a healthy diet that includes plenty of vegetables, fruits, whole grains, low-fat dairy products, and lean protein. Do not eat a lot of foods that are high in solid fats, added sugars, or salt. Make choices that simplify your life. Do not use any products that contain nicotine or tobacco, such as cigarettes, e-cigarettes, and chewing tobacco. If you need help quitting, ask your health care provider. Avoid caffeine, alcohol, and certain over-the-counter cold medicines. These may make you feel worse. Ask your pharmacist which medicines to avoid.   General instructions Take over-the-counter and prescription medicines only as told by your health care provider. Keep all follow-up visits as told by your health care provider. This is important. Where to find support You can get help and support from these sources: Self-help groups. Online and [employer]. A trusted spiritual leader. Couples counseling. Family education classes. Family therapy. Where to find more information You may find that joining a support group helps you deal with your anxiety. The following sources can help you locate counselors or support groups near you: Mental Health America: www.mentalhealthamerica.net Anxiety and Depression Association of [place] (ADAA): [employer].de [employer] on Mental Illness (NAMI): www.nami.org Contact a health care provider if you: Have a hard time staying focused or finishing daily tasks. Spend many hours a day feeling worried about everyday life. Become exhausted by worry. Start to have headaches, feel tense, or have nausea. Urinate more than normal. Have diarrhea. Get help right away if you have: A racing heart and shortness of breath. Thoughts of hurting yourself or others. If you ever feel like you may hurt yourself or others, or have thoughts about taking your own life, get help right away. You can go to your nearest emergency department or call: Your local emergency  services (911 in the [place] A suicide crisis helpline, such as the National Suicide Prevention Lifeline at 802-043-1004. This is open 24 hours a day. Summary Taking steps to learn and use tension reduction techniques can help calm you and help prevent triggering an anxiety reaction. When used together, medicines, psychotherapy, and tension reduction techniques may be the most effective treatment. Family, friends, and partners can play a big part in helping you recover from an anxiety disorder. This information is not intended to replace advice given to you by your health care provider. Make sure you discuss any questions you have with your health care provider. Document Revised: 07/10/2018 Document Reviewed: 07/10/2018 Elsevier Patient Education  2021 [employer] Inc[employer]  herapist.   Lifestyle  Maintain a healthy weight. Extra weight puts stress on your back and makes it difficult to have good posture.  Avoid activities or situations that make you feel anxious or stressed. Stress and anxiety increase muscle tension and can make back pain worse. Learn ways to manage anxiety and stress, such as  through exercise. General instructions  Sleep on a firm mattress in a comfortable position. Try lying on your side with your knees slightly bent. If you lie on your back, put a pillow under your knees.  Follow your treatment plan as told by your health care provider. This may include: ? Cognitive or behavioral therapy. ? Acupuncture or massage therapy. ? Meditation or yoga. Contact a health care provider if:  You have pain that is not relieved with rest or medicine.  You have increasing pain going down into your legs or buttocks.  Your pain does not improve after 2 weeks.  You have pain at night.  You lose weight without trying.  You have a fever or chills. Get help right away if:  You develop new bowel or bladder control problems.  You have unusual weakness or numbness in your arms or  legs.  You develop nausea or vomiting.  You develop abdominal pain.  You feel faint. Summary  Acute back pain is sudden and usually short-lived.  Use proper lifting techniques. When you bend and lift, use positions that put less stress on your back.  Take over-the-counter and prescription medicines and apply heat or ice as directed by your health care provider. This information is not intended to replace advice given to you by your health care provider. Make sure you discuss any questions you have with your health care provider. Document Revised: 11/01/2019 Document Reviewed: 11/01/2019 Elsevier Patient Education  2021 [employer].

## 2020-06-04 LAB — URINE CULTURE

## 2020-06-22 ENCOUNTER — Encounter: Payer: Self-pay | Admitting: Family Medicine

## 2020-06-24 ENCOUNTER — Encounter: Payer: Self-pay | Admitting: Family Medicine

## 2020-08-17 ENCOUNTER — Encounter: Payer: Self-pay | Admitting: Family Medicine

## 2020-08-18 ENCOUNTER — Encounter: Payer: Self-pay | Admitting: Family Medicine

## 2020-08-18 ENCOUNTER — Encounter: Payer: Self-pay | Admitting: Family

## 2020-08-18 ENCOUNTER — Ambulatory Visit (INDEPENDENT_AMBULATORY_CARE_PROVIDER_SITE_OTHER): Payer: 59 | Admitting: Family

## 2020-08-18 ENCOUNTER — Other Ambulatory Visit: Payer: Self-pay

## 2020-08-18 VITALS — BP 124/78 | HR 80 | Temp 97.9°F | Ht 63.0 in | Wt 234.2 lb

## 2020-08-18 DIAGNOSIS — F411 Generalized anxiety disorder: Secondary | ICD-10-CM

## 2020-08-18 DIAGNOSIS — J4530 Mild persistent asthma, uncomplicated: Secondary | ICD-10-CM | POA: Diagnosis not present

## 2020-08-18 DIAGNOSIS — K5900 Constipation, unspecified: Secondary | ICD-10-CM

## 2020-08-18 DIAGNOSIS — M8949 Other hypertrophic osteoarthropathy, multiple sites: Secondary | ICD-10-CM | POA: Diagnosis not present

## 2020-08-18 DIAGNOSIS — Z23 Encounter for immunization: Secondary | ICD-10-CM | POA: Diagnosis not present

## 2020-08-18 DIAGNOSIS — K219 Gastro-esophageal reflux disease without esophagitis: Secondary | ICD-10-CM | POA: Diagnosis not present

## 2020-08-18 DIAGNOSIS — M159 Polyosteoarthritis, unspecified: Secondary | ICD-10-CM

## 2020-08-18 DIAGNOSIS — E1169 Type 2 diabetes mellitus with other specified complication: Secondary | ICD-10-CM | POA: Diagnosis not present

## 2020-08-18 DIAGNOSIS — N3281 Overactive bladder: Secondary | ICD-10-CM

## 2020-08-18 LAB — BAYER DCA HB A1C WAIVED: HB A1C (BAYER DCA - WAIVED): 6.4 % (ref <7.0)

## 2020-08-18 MED ORDER — OZEMPIC (0.25 OR 0.5 MG/DOSE) 2 MG/1.5ML ~~LOC~~ SOPN: PEN_INJECTOR | SUBCUTANEOUS | 0 refills | Status: DC

## 2020-08-18 NOTE — Progress Notes (Signed)
Subjective:    Patient ID: Christina [name], female    DOB: 03/24/1960, 60 y.o.   MRN: [id number]  Chief Complaint  Patient presents with   Medical Management of Chronic Issues   PT presents to the office today for chronic follow up.  She reports she travels a lot for her work.  Diabetes She presents for her follow-up diabetic visit. She has type 2 diabetes mellitus. Hypoglycemia symptoms include nervousness/anxiousness. Associated symptoms include blurred vision. Pertinent negatives for diabetes include no foot paresthesias. Symptoms are stable. Diabetic complications include heart disease. Pertinent negatives for diabetic complications include no peripheral neuropathy. Risk factors for coronary artery disease include dyslipidemia, diabetes mellitus, hypertension and sedentary lifestyle. She is following a generally unhealthy diet. She participates in exercise daily. Her overall blood glucose range is 130-140 mg/dl. An ACE inhibitor/angiotensin II receptor blocker is being taken. Eye exam is current.  Asthma She complains of cough and wheezing. This is a chronic problem. The current episode started more than 1 year ago. The problem occurs intermittently. Associated symptoms include heartburn. Her symptoms are alleviated by rest. She reports moderate improvement on treatment. Her past medical history is significant for asthma.  Gastroesophageal Reflux She complains of belching, coughing, heartburn and wheezing. This is a chronic problem. The current episode started more than 1 year ago. The problem occurs occasionally. Risk factors include obesity. She has tried a PPI for the symptoms. The treatment provided mild relief.  Arthritis Presents for follow-up visit. She complains of pain and stiffness. The symptoms have been stable. Affected locations include the left knee, right knee, left MCP and right MCP. Her pain is at a severity of 9/10.  Urinary Frequency  This is a chronic problem. The current  episode started more than 1 year ago. The patient is experiencing no pain. Associated symptoms include frequency.  Anxiety Presents for follow-up visit. Symptoms include depressed mood, excessive worry, irritability, nervous/anxious behavior and restlessness. Symptoms occur most days. The severity of symptoms is moderate.   Her past medical history is significant for asthma.  Constipation This is a chronic problem. The current episode started more than 1 year ago. The problem has been waxing and waning since onset. Risk factors include obesity. The treatment provided moderate relief.     Review of Systems  Constitutional:  Positive for irritability.  Eyes:  Positive for blurred vision.  Respiratory:  Positive for cough and wheezing.   Gastrointestinal:  Positive for constipation and heartburn.  Genitourinary:  Positive for frequency.  Musculoskeletal:  Positive for arthritis and stiffness.  Psychiatric/Behavioral:  The patient is nervous/anxious.   All other systems reviewed and are negative.     Objective:   Physical Exam Vitals reviewed.  Constitutional:      General: She is not in acute distress.    Appearance: She is well-developed. She is obese.  HENT:     Head: Normocephalic and atraumatic.     Right Ear: Tympanic membrane normal.     Left Ear: Tympanic membrane normal.  Eyes:     Pupils: Pupils are equal, round, and reactive to light.  Neck:     Thyroid: No thyromegaly.  Cardiovascular:     Rate and Rhythm: Normal rate and regular rhythm.     Heart sounds: Normal heart sounds. No murmur heard. Pulmonary:     Effort: Pulmonary effort is normal. No respiratory distress.     Breath sounds: Normal breath sounds. No wheezing.  Abdominal:     General: Bowel sounds  are normal. There is no distension.     Palpations: Abdomen is soft.     Tenderness: There is no abdominal tenderness.  Musculoskeletal:        General: No tenderness. Normal range of motion.     Cervical  back: Normal range of motion and neck supple.  Skin:    General: Skin is warm and dry.  Neurological:     Mental Status: She is alert and oriented to person, place, and time.     Cranial Nerves: No cranial nerve deficit.     Deep Tendon Reflexes: Reflexes are normal and symmetric.  Psychiatric:        Behavior: Behavior normal.        Thought Content: Thought content normal.        Judgment: Judgment normal.      BP 124/78   Pulse 80   Temp 97.9 F (36.6 C) (Temporal)   Ht 5' 3" (1.6 m)   Wt 234 lb 3.2 oz (106.2 kg)   SpO2 96%   BMI 41.49 kg/m      Assessment & Plan:  Dicy Smigel comes in today with chief complaint of Medical Management of Chronic Issues   Diagnosis and orders addressed:  1. Type 2 diabetes mellitus with other specified complication, without long-term current use of insulin (HCC) -Start Ozempic weekly Low carb diet  - CBC with Differential/Platelet - CMP14+EGFR - Lipid panel - Bayer DCA Hb A1c Waived - Microalbumin / creatinine urine ratio - Semaglutide,0.25 or 0.5MG/DOS, (OZEMPIC, 0.25 OR 0.5 MG/DOSE,) 2 MG/1.5ML SOPN; Inject 0.25 mg into the skin once a week for 28 days, THEN 0.5 mg once a week for 28 days.  Dispense: 2.25 mL; Refill: 0  2. Mild persistent asthma without complication  3. Gastroesophageal reflux disease without esophagitis  4. Primary osteoarthritis involving multiple joints  5. Morbid obesity ([place]) - Semaglutide,0.25 or 0.5MG/DOS, (OZEMPIC, 0.25 OR 0.5 MG/DOSE,) 2 MG/1.5ML SOPN; Inject 0.25 mg into the skin once a week for 28 days, THEN 0.5 mg once a week for 28 days.  Dispense: 2.25 mL; Refill: 0  6. GAD (generalized anxiety disorder)  7. Constipation, unspecified constipation type  8. OAB (overactive bladder)    Labs pending Health Maintenance reviewed Diet and exercise encouraged  Follow up plan: 3 months   Evelina Dun, FNP

## 2020-08-18 NOTE — Patient Instructions (Signed)
Diabetes Mellitus and Nutrition, Adult When you have diabetes, or diabetes mellitus, it is very important to have healthy eating habits because your blood sugar (glucose) levels are greatly affected by what you eat and drink. Eating healthy foods in the right amounts, at about the same times every day, can help you:  Control your blood glucose.  Lower your risk of heart disease.  Improve your blood pressure.  Reach or maintain a healthy weight. What can affect my meal plan? Every person with diabetes is different, and each person has different needs for a meal plan. Your health care provider may recommend that you work with a dietitian to make a meal plan that is best for you. Your meal plan may vary depending on factors such as:  The calories you need.  The medicines you take.  Your weight.  Your blood glucose, blood pressure, and cholesterol levels.  Your activity level.  Other health conditions you have, such as heart or kidney disease. How do carbohydrates affect me? Carbohydrates, also called carbs, affect your blood glucose level more than any other type of food. Eating carbs naturally raises the amount of glucose in your blood. Carb counting is a method for keeping track of how many carbs you eat. Counting carbs is important to keep your blood glucose at a healthy level, especially if you use insulin or take certain oral diabetes medicines. It is important to know how many carbs you can safely have in each meal. This is different for every person. Your dietitian can help you calculate how many carbs you should have at each meal and for each snack. How does alcohol affect me? Alcohol can cause a sudden decrease in blood glucose (hypoglycemia), especially if you use insulin or take certain oral diabetes medicines. Hypoglycemia can be a life-threatening condition. Symptoms of hypoglycemia, such as sleepiness, dizziness, and confusion, are similar to symptoms of having too much  alcohol.  Do not drink alcohol if: ? Your health care provider tells you not to drink. ? You are pregnant, may be pregnant, or are planning to become pregnant.  If you drink alcohol: ? Do not drink on an empty stomach. ? Limit how much you use to:  0-1 drink a day for women.  0-2 drinks a day for men. ? Be aware of how much alcohol is in your drink. In the [place] one drink equals one 12 oz bottle of beer (355 mL), one 5 oz glass of wine (148 mL), or one 1 oz glass of hard liquor (44 mL). ? Keep yourself hydrated with water, diet soda, or unsweetened iced tea.  Keep in mind that regular soda, juice, and other mixers may contain a lot of sugar and must be counted as carbs. What are tips for following this plan? Reading food labels  Start by checking the serving size on the "Nutrition Facts" label of packaged foods and drinks. The amount of calories, carbs, fats, and other nutrients listed on the label is based on one serving of the item. Many items contain more than one serving per package.  Check the total grams (g) of carbs in one serving. You can calculate the number of servings of carbs in one serving by dividing the total carbs by 15. For example, if a food has 30 g of total carbs per serving, it would be equal to 2 servings of carbs.  Check the number of grams (g) of saturated fats and trans fats in one serving. Choose foods that have   a low amount or none of these fats.  Check the number of milligrams (mg) of salt (sodium) in one serving. Most people should limit total sodium intake to less than 2,300 mg per day.  Always check the nutrition information of foods labeled as "low-fat" or "nonfat." These foods may be higher in added sugar or refined carbs and should be avoided.  Talk to your dietitian to identify your daily goals for nutrients listed on the label. Shopping  Avoid buying canned, pre-made, or processed foods. These foods tend to be high in fat, sodium, and added  sugar.  Shop around the outside edge of the grocery store. This is where you will most often find fresh fruits and vegetables, bulk grains, fresh meats, and fresh dairy. Cooking  Use low-heat cooking methods, such as baking, instead of high-heat cooking methods like deep frying.  Cook using healthy oils, such as olive, canola, or sunflower oil.  Avoid cooking with butter, cream, or high-fat meats. Meal planning  Eat meals and snacks regularly, preferably at the same times every day. Avoid going long periods of time without eating.  Eat foods that are high in fiber, such as fresh fruits, vegetables, beans, and whole grains. Talk with your dietitian about how many servings of carbs you can eat at each meal.  Eat 4-6 oz (112-168 g) of lean protein each day, such as lean meat, chicken, fish, eggs, or tofu. One ounce (oz) of lean protein is equal to: ? 1 oz (28 g) of meat, chicken, or fish. ? 1 egg. ?  cup (62 g) of tofu.  Eat some foods each day that contain healthy fats, such as avocado, nuts, seeds, and fish.   What foods should I eat? Fruits Berries. Apples. Oranges. Peaches. Apricots. Plums. Grapes. Mango. Papaya. Pomegranate. Kiwi. Cherries. Vegetables Lettuce. Spinach. Leafy greens, including kale, chard, collard greens, and mustard greens. Beets. Cauliflower. Cabbage. Broccoli. Carrots. Green beans. Tomatoes. Peppers. Onions. Cucumbers. Brussels sprouts. Grains Whole grains, such as whole-wheat or whole-grain bread, crackers, tortillas, cereal, and pasta. Unsweetened oatmeal. Quinoa. Brown or wild rice. Meats and other proteins Seafood. Poultry without skin. Lean cuts of poultry and beef. Tofu. Nuts. Seeds. Dairy Low-fat or fat-free dairy products such as milk, yogurt, and cheese. The items listed above may not be a complete list of foods and beverages you can eat. Contact a dietitian for more information. What foods should I avoid? Fruits Fruits canned with  syrup. Vegetables Canned vegetables. Frozen vegetables with butter or cream sauce. Grains Refined white flour and flour products such as bread, pasta, snack foods, and cereals. Avoid all processed foods. Meats and other proteins Fatty cuts of meat. Poultry with skin. Breaded or fried meats. Processed meat. Avoid saturated fats. Dairy Full-fat yogurt, cheese, or milk. Beverages Sweetened drinks, such as soda or iced tea. The items listed above may not be a complete list of foods and beverages you should avoid. Contact a dietitian for more information. Questions to ask a health care provider  Do I need to meet with a diabetes educator?  Do I need to meet with a dietitian?  What number can I call if I have questions?  When are the best times to check my blood glucose? Where to find more information:  American Diabetes Association: diabetes.org  Academy of Nutrition and Dietetics: www.eatright.org  National Institute of Diabetes and Digestive and Kidney Diseases: www.niddk.nih.gov  Association of Diabetes Care and Education Specialists: www.diabeteseducator.org Summary  It is important to have healthy eating   habits because your blood sugar (glucose) levels are greatly affected by what you eat and drink.  A healthy meal plan will help you control your blood glucose and maintain a healthy lifestyle.  Your health care provider may recommend that you work with a dietitian to make a meal plan that is best for you.  Keep in mind that carbohydrates (carbs) and alcohol have immediate effects on your blood glucose levels. It is important to count carbs and to use alcohol carefully. This information is not intended to replace advice given to you by your health care provider. Make sure you discuss any questions you have with your health care provider. Document Revised: 01/15/2019 Document Reviewed: 01/15/2019 Elsevier Patient Education  2021 Elsevier Inc.  

## 2020-08-19 LAB — CBC WITH DIFFERENTIAL/PLATELET
Basophils Absolute: 0 10*3/uL (ref 0.0–0.2)
Basos: 0 %
EOS (ABSOLUTE): 0.4 10*3/uL (ref 0.0–0.4)
Eos: 5 %
Hematocrit: 40.2 % (ref 34.0–46.6)
Hemoglobin: 13.6 g/dL (ref 11.1–15.9)
Immature Grans (Abs): 0 10*3/uL (ref 0.0–0.1)
Immature Granulocytes: 0 %
Lymphocytes Absolute: 2.3 10*3/uL (ref 0.7–3.1)
Lymphs: 26 %
MCH: 29.5 pg (ref 26.6–33.0)
MCHC: 33.8 g/dL (ref 31.5–35.7)
MCV: 87 fL (ref 79–97)
Monocytes Absolute: 0.4 10*3/uL (ref 0.1–0.9)
Monocytes: 4 %
Neutrophils Absolute: 5.7 10*3/uL (ref 1.4–7.0)
Neutrophils: 65 %
Platelets: 250 10*3/uL (ref 150–450)
RBC: 4.61 x10E6/uL (ref 3.77–5.28)
RDW: 15.3 % (ref 11.7–15.4)
WBC: 8.9 10*3/uL (ref 3.4–10.8)

## 2020-08-19 LAB — CMP14+EGFR
ALT: 16 IU/L (ref 0–32)
AST: 18 IU/L (ref 0–40)
Albumin/Globulin Ratio: 1.4 (ref 1.2–2.2)
Albumin: 4.2 g/dL (ref 3.8–4.9)
Alkaline Phosphatase: 126 IU/L — ABNORMAL HIGH (ref 44–121)
BUN/Creatinine Ratio: 17 (ref 9–23)
BUN: 13 mg/dL (ref 6–24)
Bilirubin Total: 0.2 mg/dL (ref 0.0–1.2)
CO2: 27 mmol/L (ref 20–29)
Calcium: 9.3 mg/dL (ref 8.7–10.2)
Chloride: 103 mmol/L (ref 96–106)
Creatinine, Ser: 0.76 mg/dL (ref 0.57–1.00)
Globulin, Total: 2.9 g/dL (ref 1.5–4.5)
Glucose: 212 mg/dL — ABNORMAL HIGH (ref 65–99)
Potassium: 4.3 mmol/L (ref 3.5–5.2)
Sodium: 142 mmol/L (ref 134–144)
Total Protein: 7.1 g/dL (ref 6.0–8.5)
eGFR: 91 mL/min/{1.73_m2} (ref >59)

## 2020-08-19 LAB — LIPID PANEL
Chol/HDL Ratio: 3.4 ratio (ref 0.0–4.4)
Cholesterol, Total: 166 mg/dL (ref 100–199)
HDL: 49 mg/dL (ref >39)
LDL Chol Calc (NIH): 86 mg/dL (ref 0–99)
Triglycerides: 182 mg/dL — ABNORMAL HIGH (ref 0–149)
VLDL Cholesterol Cal: 31 mg/dL (ref 5–40)

## 2020-08-19 LAB — MICROALBUMIN / CREATININE URINE RATIO
Creatinine, Urine: 102 mg/dL
Microalb/Creat Ratio: 5 mg/g creat (ref 0–29)
Microalbumin, Urine: 5.5 ug/mL

## 2020-08-20 ENCOUNTER — Other Ambulatory Visit: Payer: Self-pay | Admitting: Family

## 2020-08-20 DIAGNOSIS — E1169 Type 2 diabetes mellitus with other specified complication: Secondary | ICD-10-CM

## 2020-11-23 ENCOUNTER — Other Ambulatory Visit (HOSPITAL_BASED_OUTPATIENT_CLINIC_OR_DEPARTMENT_OTHER): Payer: Self-pay | Admitting: Family

## 2020-11-23 DIAGNOSIS — Z1231 Encounter for screening mammogram for malignant neoplasm of breast: Secondary | ICD-10-CM

## 2020-11-24 ENCOUNTER — Encounter (HOSPITAL_BASED_OUTPATIENT_CLINIC_OR_DEPARTMENT_OTHER): Payer: Self-pay

## 2020-11-24 ENCOUNTER — Ambulatory Visit (HOSPITAL_BASED_OUTPATIENT_CLINIC_OR_DEPARTMENT_OTHER)
Admission: RE | Admit: 2020-11-24 | Discharge: 2020-11-24 | Disposition: A | Discharge status: Home / Self Care | Payer: 59 | Source: Ambulatory Visit | Attending: Family | Admitting: Family

## 2020-11-24 ENCOUNTER — Other Ambulatory Visit: Payer: Self-pay

## 2020-11-24 DIAGNOSIS — Z1231 Encounter for screening mammogram for malignant neoplasm of breast: Secondary | ICD-10-CM | POA: Diagnosis not present

## 2021-01-12 ENCOUNTER — Encounter: Payer: Self-pay | Admitting: Family

## 2021-01-12 ENCOUNTER — Ambulatory Visit (INDEPENDENT_AMBULATORY_CARE_PROVIDER_SITE_OTHER): Payer: 59 | Admitting: Family

## 2021-01-12 ENCOUNTER — Other Ambulatory Visit: Payer: Self-pay

## 2021-01-12 VITALS — BP 123/72 | HR 72 | Temp 97.7°F | Ht 63.0 in | Wt 221.0 lb

## 2021-01-12 DIAGNOSIS — K5900 Constipation, unspecified: Secondary | ICD-10-CM

## 2021-01-12 DIAGNOSIS — M159 Polyosteoarthritis, unspecified: Secondary | ICD-10-CM

## 2021-01-12 DIAGNOSIS — K219 Gastro-esophageal reflux disease without esophagitis: Secondary | ICD-10-CM

## 2021-01-12 DIAGNOSIS — J4530 Mild persistent asthma, uncomplicated: Secondary | ICD-10-CM

## 2021-01-12 DIAGNOSIS — E1169 Type 2 diabetes mellitus with other specified complication: Secondary | ICD-10-CM

## 2021-01-12 DIAGNOSIS — D509 Iron deficiency anemia, unspecified: Secondary | ICD-10-CM

## 2021-01-12 DIAGNOSIS — E785 Hyperlipidemia, unspecified: Secondary | ICD-10-CM

## 2021-01-12 DIAGNOSIS — F411 Generalized anxiety disorder: Secondary | ICD-10-CM | POA: Diagnosis not present

## 2021-01-12 LAB — BAYER DCA HB A1C WAIVED: HB A1C (BAYER DCA - WAIVED): 5.3 % (ref 4.8–5.6)

## 2021-01-12 MED ORDER — BUDESONIDE-FORMOTEROL FUMARATE 80-4.5 MCG/ACT IN AERO: 2.0000 | INHALATION_SPRAY | Freq: Two times a day (BID) | RESPIRATORY_TRACT | 3 refills | Status: AC

## 2021-01-12 MED ORDER — ALBUTEROL SULFATE HFA 108 (90 BASE) MCG/ACT IN AERS: 2.0000 | INHALATION_SPRAY | Freq: Four times a day (QID) | RESPIRATORY_TRACT | 1 refills | Status: AC | PRN

## 2021-01-12 MED ORDER — MONTELUKAST SODIUM 10 MG PO TABS: 10.0000 mg | ORAL_TABLET | Freq: Every day | ORAL | 1 refills | Status: DC

## 2021-01-12 MED ORDER — METFORMIN HCL ER 500 MG PO TB24: 500.0000 mg | ORAL_TABLET | Freq: Every day | ORAL | 1 refills | Status: DC

## 2021-01-12 MED ORDER — CETIRIZINE HCL 10 MG PO TABS: 10.0000 mg | ORAL_TABLET | Freq: Every day | ORAL | 11 refills | Status: DC

## 2021-01-12 MED ORDER — OMEPRAZOLE 40 MG PO CPDR: DELAYED_RELEASE_CAPSULE | ORAL | 1 refills | Status: DC

## 2021-01-12 MED ORDER — ATORVASTATIN CALCIUM 10 MG PO TABS
10.0000 mg | ORAL_TABLET | Freq: Every day | ORAL | 1 refills | Status: DC
Start: 2021-01-12 — End: 2021-05-26

## 2021-01-12 NOTE — Patient Instructions (Signed)

## 2021-01-12 NOTE — Progress Notes (Signed)
Subjective:    Patient ID: Christina [name], female    DOB: 05-15-1960, 60 y.o.   MRN: [id number]  Chief Complaint  Patient presents with   Medical Management of Chronic Issues   Medication Problem    wants to come off some    Breathing Problem    Having troubles    Constipation    Seen Dr. Trenton Founds of states taken 3-4 a day. And laxative.   PT presents to the office today for chronic follow up.  She reports she travels a lot for her work.   She reports she has stopped all of her medications for the last week and half because of constipation.   She is morbid obese with a BMI of 39 and DM and HTN.  Constipation This is a chronic problem. The current episode started 1 to 4 weeks ago. The problem has been waxing and waning since onset. Her stool frequency is 2 to 3 times per week. She has tried laxatives for the symptoms. The treatment provided moderate relief.  Asthma She complains of cough, shortness of breath and wheezing. This is a chronic problem. The current episode started more than 1 year ago. The problem occurs intermittently. The problem has been waxing and waning. Associated symptoms include heartburn and malaise/fatigue. Her symptoms are alleviated by rest. She reports moderate improvement on treatment. Her past medical history is significant for asthma.  Gastroesophageal Reflux She complains of belching, coughing, heartburn and wheezing. This is a chronic problem. The current episode started more than 1 year ago. The problem occurs occasionally. The problem has been waxing and waning. Risk factors include obesity. She has tried a PPI for the symptoms. The treatment provided mild relief.  Arthritis Presents for follow-up visit. She complains of pain and stiffness. Affected locations include the left knee, right knee, right MCP and left MCP. Her pain is at a severity of 4/10.  Anxiety Presents for follow-up visit. Symptoms include depressed mood, excessive worry, irritability,  nervous/anxious behavior and shortness of breath. Patient reports no confusion. Symptoms occur most days. The severity of symptoms is moderate.   Her past medical history is significant for anemia and asthma.  Anemia Presents for follow-up visit. Symptoms include malaise/fatigue. There has been no confusion.     Review of Systems  Constitutional:  Positive for irritability and malaise/fatigue.  Respiratory:  Positive for cough, shortness of breath and wheezing.   Gastrointestinal:  Positive for constipation and heartburn.  Musculoskeletal:  Positive for arthritis and stiffness.  Psychiatric/Behavioral:  Negative for confusion. The patient is nervous/anxious.   All other systems reviewed and are negative.     Objective:   Physical Exam Vitals reviewed.  Constitutional:      General: She is not in acute distress.    Appearance: She is well-developed.  HENT:     Head: Normocephalic and atraumatic.     Right Ear: Tympanic membrane normal.     Left Ear: Tympanic membrane normal.  Eyes:     Pupils: Pupils are equal, round, and reactive to light.  Neck:     Thyroid: No thyromegaly.  Cardiovascular:     Rate and Rhythm: Normal rate and regular rhythm.     Heart sounds: Normal heart sounds. No murmur heard. Pulmonary:     Effort: Pulmonary effort is normal. No respiratory distress.     Breath sounds: Normal breath sounds. No wheezing.  Abdominal:     General: Bowel sounds are normal. There is no distension.  Palpations: Abdomen is soft.     Tenderness: There is no abdominal tenderness.  Musculoskeletal:        General: No tenderness. Normal range of motion.     Cervical back: Normal range of motion and neck supple.  Skin:    General: Skin is warm and dry.  Neurological:     Mental Status: She is alert and oriented to person, place, and time.     Cranial Nerves: No cranial nerve deficit.     Deep Tendon Reflexes: Reflexes are normal and symmetric.  Psychiatric:         Behavior: Behavior normal.        Thought Content: Thought content normal.        Judgment: Judgment normal.     BP 123/72   Pulse 72   Temp 97.7 F (36.5 C) (Temporal)   Ht _0  (1.6 m)   Wt 221 lb (100.2 kg)   BMI 39.15 kg/m       Assessment & Plan:  Christina [name] comes in today with chief complaint of Medical Management of Chronic Issues, Medication Problem (wants to come off some ), Breathing Problem (Having troubles ), and Constipation (Seen Dr. Trenton Founds of states taken 3-4 a day. And laxative.)   Diagnosis and orders addressed:  1. Mild persistent asthma without complication Restart singular and zyrtec  Avoid allergens  Restart Symbicort  - montelukast (SINGULAIR) 10 MG tablet; Take 1 tablet (10 mg total) by mouth at bedtime.  Dispense: 90 tablet; Refill: 1 - albuterol (VENTOLIN HFA) 108 (90 Base) MCG/ACT inhaler; Inhale 2 puffs into the lungs every 6 (six) hours as needed for wheezing or shortness of breath.  Dispense: 54 g; Refill: 1 - cetirizine (ZYRTEC) 10 MG tablet; Take 1 tablet (10 mg total) by mouth daily.  Dispense: 30 tablet; Refill: 11 - budesonide-formoterol (SYMBICORT) 80-4.5 MCG/ACT inhaler; Inhale 2 puffs into the lungs 2 (two) times daily.  Dispense: 1 each; Refill: 3 - CMP14+EGFR  2. Gastroesophageal reflux disease without esophagitis - omeprazole (PRILOSEC) 40 MG capsule; TAKE 1 CAPSULE DAILY  Dispense: 90 capsule; Refill: 1 - CMP14+EGFR  3. Primary osteoarthritis involving multiple joints - CMP14+EGFR  4. GAD (generalized anxiety disorder)  - CMP14+EGFR  5. Morbid obesity ([place]) - CMP14+EGFR  6. Constipation, unspecified constipation type - CMP14+EGFR  7. Type 2 diabetes mellitus with other specified complication, without long-term current use of insulin (HCC) - metFORMIN (GLUCOPHAGE-XR) 500 MG 24 hr tablet; Take 1 tablet (500 mg total) by mouth daily.  Dispense: 90 tablet; Refill: 1 - CMP14+EGFR - Bayer DCA Hb A1c Waived  8. Hyperlipidemia  associated with type 2 diabetes mellitus (HCC) - atorvastatin (LIPITOR) 10 MG tablet; Take 1 tablet (10 mg total) by mouth daily.  Dispense: 90 tablet; Refill: 1 - CMP14+EGFR - Lipid panel  9. Iron deficiency anemia, unspecified iron deficiency anemia type - Anemia Profile B   Labs pending Health Maintenance reviewed Diet and exercise encouraged  Follow up plan: 3 months    Evelina Dun, FNP

## 2021-01-13 LAB — ANEMIA PROFILE B
Basophils Absolute: 0.1 10*3/uL (ref 0.0–0.2)
Basos: 1 %
EOS (ABSOLUTE): 0.5 10*3/uL — ABNORMAL HIGH (ref 0.0–0.4)
Eos: 6 %
Ferritin: 85 ng/mL (ref 15–150)
Folate: 4.1 ng/mL (ref >3.0)
Hematocrit: 43.1 % (ref 34.0–46.6)
Hemoglobin: 14.3 g/dL (ref 11.1–15.9)
Immature Grans (Abs): 0 10*3/uL (ref 0.0–0.1)
Immature Granulocytes: 0 %
Iron Saturation: 23 % (ref 15–55)
Iron: 69 ug/dL (ref 27–159)
Lymphocytes Absolute: 2.4 10*3/uL (ref 0.7–3.1)
Lymphs: 30 %
MCH: 30.6 pg (ref 26.6–33.0)
MCHC: 33.2 g/dL (ref 31.5–35.7)
MCV: 92 fL (ref 79–97)
Monocytes Absolute: 0.5 10*3/uL (ref 0.1–0.9)
Monocytes: 6 %
Neutrophils Absolute: 4.6 10*3/uL (ref 1.4–7.0)
Neutrophils: 57 %
Platelets: 233 10*3/uL (ref 150–450)
RBC: 4.67 x10E6/uL (ref 3.77–5.28)
RDW: 12.8 % (ref 11.7–15.4)
Retic Ct Pct: 1.5 % (ref 0.6–2.6)
Total Iron Binding Capacity: 299 ug/dL (ref 250–450)
UIBC: 230 ug/dL (ref 131–425)
Vitamin B-12: 184 pg/mL — ABNORMAL LOW (ref 232–1245)
WBC: 8.1 10*3/uL (ref 3.4–10.8)

## 2021-01-13 LAB — CMP14+EGFR
ALT: 26 IU/L (ref 0–32)
AST: 25 IU/L (ref 0–40)
Albumin/Globulin Ratio: 1.7 (ref 1.2–2.2)
Albumin: 4.5 g/dL (ref 3.8–4.9)
Alkaline Phosphatase: 126 IU/L — ABNORMAL HIGH (ref 44–121)
BUN/Creatinine Ratio: 17 (ref 9–23)
BUN: 14 mg/dL (ref 6–24)
Bilirubin Total: 0.2 mg/dL (ref 0.0–1.2)
CO2: 25 mmol/L (ref 20–29)
Calcium: 9.2 mg/dL (ref 8.7–10.2)
Chloride: 104 mmol/L (ref 96–106)
Creatinine, Ser: 0.83 mg/dL (ref 0.57–1.00)
Globulin, Total: 2.7 g/dL (ref 1.5–4.5)
Glucose: 107 mg/dL — ABNORMAL HIGH (ref 70–99)
Potassium: 4.5 mmol/L (ref 3.5–5.2)
Sodium: 143 mmol/L (ref 134–144)
Total Protein: 7.2 g/dL (ref 6.0–8.5)
eGFR: 81 mL/min/{1.73_m2} (ref >59)

## 2021-01-13 LAB — LIPID PANEL
Chol/HDL Ratio: 5.6 ratio — ABNORMAL HIGH (ref 0.0–4.4)
Cholesterol, Total: 225 mg/dL — ABNORMAL HIGH (ref 100–199)
HDL: 40 mg/dL (ref >39)
LDL Chol Calc (NIH): 144 mg/dL — ABNORMAL HIGH (ref 0–99)
Triglycerides: 226 mg/dL — ABNORMAL HIGH (ref 0–149)
VLDL Cholesterol Cal: 41 mg/dL — ABNORMAL HIGH (ref 5–40)

## 2021-01-18 MED ORDER — CYANOCOBALAMIN 1000 MCG/ML IJ SOLN: INTRAMUSCULAR | 2 refills | Status: AC

## 2021-01-18 NOTE — Addendum Note (Signed)
Addended by: Jannifer Rodney A on: 01/18/2021 02:19 PM   Modules accepted: Orders

## 2021-01-21 ENCOUNTER — Telehealth: Payer: Self-pay | Admitting: Family

## 2021-01-21 MED ORDER — "SYRINGE/NEEDLE (DISP) 25G X 1"" 3 ML MISC": 0 refills | Status: AC

## 2021-01-21 NOTE — Telephone Encounter (Signed)
  Prescription Request  01/21/2021  Is this a "Controlled Substance" medicine? no  Have you seen your PCP in the last 2 weeks?   If YES, route message to pool  -  If NO, patient needs to be scheduled for appointment.  What is the name of the medication or equipment? Pt needs syringes for b12 shots  Have you contacted your pharmacy to request a refill? no   Which pharmacy would you like this sent to? [facility]   Patient notified that their request is being sent to the clinical staff for review and that they should receive a response within 2 business days.

## 2021-01-21 NOTE — Telephone Encounter (Signed)
Pt had knee replacement yesterday in [place], sent syringes to [facility] in [place], [place]

## 2021-02-02 ENCOUNTER — Telehealth: Payer: Self-pay | Admitting: Family

## 2021-02-02 NOTE — Telephone Encounter (Signed)
Case manager from hospital in [place] calling to set up a hospital follow up and would like for mom to have Home Health therapy. Please call back,

## 2021-02-02 NOTE — Telephone Encounter (Signed)
Patient has been in rehab after knee replacement and will be discharged later this week from them and see her orthopedic doctor the first of next week.  Needs appointment to see Neysa Bonito and set up home health physical therapy.  Appointment scheduled for 02/11/21 with Jannifer Rodney.

## 2021-02-11 ENCOUNTER — Encounter: Payer: Self-pay | Admitting: Family

## 2021-02-11 ENCOUNTER — Ambulatory Visit (INDEPENDENT_AMBULATORY_CARE_PROVIDER_SITE_OTHER): Payer: 59 | Admitting: Family

## 2021-02-11 VITALS — BP 126/82 | HR 84 | Temp 97.8°F | Ht 63.0 in | Wt 217.2 lb

## 2021-02-11 DIAGNOSIS — Z96652 Presence of left artificial knee joint: Secondary | ICD-10-CM | POA: Diagnosis not present

## 2021-02-11 DIAGNOSIS — M159 Polyosteoarthritis, unspecified: Secondary | ICD-10-CM

## 2021-02-11 DIAGNOSIS — R531 Weakness: Secondary | ICD-10-CM | POA: Diagnosis not present

## 2021-02-11 MED ORDER — CYCLOBENZAPRINE HCL 10 MG PO TABS: 10.0000 mg | ORAL_TABLET | Freq: Three times a day (TID) | ORAL | 3 refills | Status: DC | PRN

## 2021-02-11 NOTE — Patient Instructions (Signed)
Total Knee Replacement, Care After After the procedure, it is common to have stiffness and discomfort, or redness, pain, and swelling around your cut from surgery (incision). You may also have a small amount of blood or clear fluid coming from your incision. Follow these instructions at home: Your doctor may give you more instructions. If you have problems, contact your doctor. Medicines Take over-the-counter and prescription medicines only as told by your doctor. If you were prescribed a blood thinner, take it as told by your doctor. If told, take steps to prevent problems with pooping (constipation). You may need to: Drink enough fluid to keep your pee (urine) pale yellow. Take medicines. You will be told what medicines to take. Eat foods that are high in fiber. These include beans, whole grains, and fresh fruits and vegetables. Limit foods that are high in fat and sugar. These include fried or sweet foods. Incision care  Follow instructions from your doctor about how to take care of your incision. Make sure you: Wash your hands with soap and water for at least 20 seconds before and after you change your bandage. If you cannot use soap and water, use hand sanitizer. Change your bandage. Leave stitches, staples, or skin glue in place for at least 2 weeks. Leave tape strips alone unless you are told to take them off. You may trim the edges of the tape strips if they curl up. Do not take baths, swim, or use a hot tub until your doctor says it is okay. Check your incision every day for signs of infection. Check for: More redness, swelling, or pain. More fluid or blood. Warmth. Pus or a bad smell. Activity Rest as told by your doctor. Get up to take short walks every 1 to 2 hours. Ask for help if you feel weak or unsteady. Follow instructions from your doctor about: Using a walker, crutches, or a cane. How much body weight you may safely support on your affected leg (weight-bearing  restrictions). How to get out of a bed and chair and how to go up and down stairs. A physical therapist will show you how to do this. Do exercises as told by your doctor or physical therapist. Avoid activities that put stress on your knees. These include running, jumping rope, and doing jumping jacks. Do not play contact sports until your doctor says it is okay. Return to your normal activities when your doctor says that it is safe. Managing pain, stiffness, and swelling  If told, put ice on your knee. To do this: Put ice in a plastic bag or use an icing device (cold flow pad). Follow your doctor's directions about how to use the icing device. Place a towel between your skin and the bag or between your skin and the icing device. Leave the ice on for 20 minutes, 2-3 times a day. Take off the ice if your skin turns bright red. This is very important. If you cannot feel pain, heat, or cold, you have a greater risk of damage to the area. Move your toes often. Raise your leg above the level of your heart while you are sitting or lying down. Use a few pillows to keep your leg straight. Do not put a pillow just under the knee. If the knee is bent for a long time, this may make the knee stiff. Wear elastic knee support as told by your doctor. Safety  To help prevent falls: Keep floors clear of objects you may trip over. Place items  that you may need within easy reach. Wear an apron or tool belt with pockets for carrying objects. This leaves your hands free to help with your balance. Do not drive or use machines until your doctor says it is safe. General instructions Wear compression stockings as told by your doctor. Continue with breathing exercises. This helps prevent lung infection. Do not smoke or use any products that contain nicotine or tobacco. If you need help quitting, ask your doctor. If you plan to visit a dentist: Tell your doctor. Ask about things to do before your teeth are  cleaned. Tell your dentist about your new joint. Keep all follow-up visits. Contact a doctor if: You have a fever or chills. You have a cough or feel short of breath. Your medicine is not controlling your pain. You have any of these signs of infection around your incision: More redness, swelling, or pain. More fluid or blood. Warmth. Pus or a bad smell. You fall. Get help right away if: You have very bad pain. You have trouble breathing. You have chest pain. You have pain in your calf or leg. You have redness, swelling, or warmth in your calf or leg. Your incision breaks open after the stitches or staples are taken out. These symptoms may be an emergency. Get help right away. Call your local emergency services (911 in the [place] Do not wait to see if the symptoms will go away. Do not drive yourself to the hospital. Summary After the procedure, it is common to have stiffness and discomfort, or redness, pain, and swelling around your incision. Follow instructions from your doctor about how to take care of your incision. Use crutches, a walker, or a cane as told by your doctor. If you were prescribed a blood thinner, take it as told by your doctor. Keep all follow-up visits. This information is not intended to replace advice given to you by your health care provider. Make sure you discuss any questions you have with your health care provider. Document Revised: 07/30/2019 Document Reviewed: 07/30/2019 Elsevier Patient Education  2022 [employer].

## 2021-02-11 NOTE — Progress Notes (Signed)
Subjective:    Patient ID: Christina [name], female    DOB: 1960-04-12, 60 y.o.   MRN: [id number]  No chief complaint on file.  PT presents to the office today to follow up on SNF discharge. She has a left total knee replacement on 11/30 in [place]. Afterwards was discharge to rehab. She was released from Rehab on 02/05/21.   She reports she is having a mild pain of 4 out 10 when sitting still. She reports 7 out 10 at night and 8-9 out 10 when walking. She has been taking tylenol with mild relief.  Knee Pain  The incident occurred more than 1 week ago. The pain is present in the left knee. The pain is severe. The pain has been Constant since onset. Associated symptoms include an inability to bear weight. Pertinent negatives include no loss of sensation, numbness or tingling. She reports no foreign bodies present. The symptoms are aggravated by movement and weight bearing. She has tried acetaminophen for the symptoms. The treatment provided mild relief.     Review of Systems  Neurological:  Negative for tingling and numbness.  All other systems reviewed and are negative.     Objective:   Physical Exam Vitals reviewed.  Constitutional:      General: She is not in acute distress.    Appearance: She is well-developed. She is obese.  HENT:     Head: Normocephalic and atraumatic.  Eyes:     Pupils: Pupils are equal, round, and reactive to light.  Neck:     Thyroid: No thyromegaly.  Cardiovascular:     Rate and Rhythm: Normal rate and regular rhythm.     Heart sounds: Normal heart sounds. No murmur heard. Pulmonary:     Effort: Pulmonary effort is normal. No respiratory distress.     Breath sounds: Normal breath sounds. No wheezing.  Abdominal:     General: Bowel sounds are normal. There is no distension.     Palpations: Abdomen is soft.     Tenderness: There is no abdominal tenderness.  Musculoskeletal:        General: No tenderness.     Cervical back: Normal range of motion and  neck supple.     Comments: Left knee swollen, incision well approximated. Using walker to walk  Skin:    General: Skin is warm and dry.  Neurological:     Mental Status: She is alert and oriented to person, place, and time.     Cranial Nerves: No cranial nerve deficit.     Deep Tendon Reflexes: Reflexes are normal and symmetric.  Psychiatric:        Behavior: Behavior normal.        Thought Content: Thought content normal.        Judgment: Judgment normal.    BP 126/82    Pulse 84    Temp 97.8 F (36.6 C) (Temporal)    Ht 5' 3" (1.6 m)    Wt 217 lb 3.2 oz (98.5 kg)    BMI 38.48 kg/m        Assessment & Plan:  Christina [name] comes in today with chief complaint of Knee Pain   Diagnosis and orders addressed:  1. History of total left knee replacement - Ambulatory referral to [facility] with Differential/Platelet  2. Weakness - Ambulatory referral to [facility] with Differential/Platelet  3. Morbid obesity ([place]) - Ambulatory referral to [facility]  with Differential/Platelet  4. Primary osteoarthritis involving multiple joints - Ambulatory referral to [facility] with Differential/Platelet   Labs pending Health Maintenance reviewed Diet and exercise encouraged  Follow up plan: 3 months    Evelina Dun, FNP

## 2021-02-12 LAB — CMP14+EGFR
ALT: 23 IU/L (ref 0–32)
AST: 23 IU/L (ref 0–40)
Albumin/Globulin Ratio: 1.7 (ref 1.2–2.2)
Albumin: 4.5 g/dL (ref 3.8–4.9)
Alkaline Phosphatase: 158 IU/L — ABNORMAL HIGH (ref 44–121)
BUN/Creatinine Ratio: 17 (ref 9–23)
BUN: 15 mg/dL (ref 6–24)
Bilirubin Total: 0.2 mg/dL (ref 0.0–1.2)
CO2: 24 mmol/L (ref 20–29)
Calcium: 9.7 mg/dL (ref 8.7–10.2)
Chloride: 104 mmol/L (ref 96–106)
Creatinine, Ser: 0.87 mg/dL (ref 0.57–1.00)
Globulin, Total: 2.7 g/dL (ref 1.5–4.5)
Glucose: 98 mg/dL (ref 70–99)
Potassium: 4.9 mmol/L (ref 3.5–5.2)
Sodium: 144 mmol/L (ref 134–144)
Total Protein: 7.2 g/dL (ref 6.0–8.5)
eGFR: 77 mL/min/{1.73_m2} (ref >59)

## 2021-02-12 LAB — CBC WITH DIFFERENTIAL/PLATELET
Basophils Absolute: 0.1 10*3/uL (ref 0.0–0.2)
Basos: 1 %
EOS (ABSOLUTE): 0.6 10*3/uL — ABNORMAL HIGH (ref 0.0–0.4)
Eos: 7 %
Hematocrit: 42.4 % (ref 34.0–46.6)
Hemoglobin: 13.5 g/dL (ref 11.1–15.9)
Immature Grans (Abs): 0 10*3/uL (ref 0.0–0.1)
Immature Granulocytes: 0 %
Lymphocytes Absolute: 2.4 10*3/uL (ref 0.7–3.1)
Lymphs: 28 %
MCH: 29.2 pg (ref 26.6–33.0)
MCHC: 31.8 g/dL (ref 31.5–35.7)
MCV: 92 fL (ref 79–97)
Monocytes Absolute: 0.5 10*3/uL (ref 0.1–0.9)
Monocytes: 6 %
Neutrophils Absolute: 4.9 10*3/uL (ref 1.4–7.0)
Neutrophils: 58 %
Platelets: 331 10*3/uL (ref 150–450)
RBC: 4.62 x10E6/uL (ref 3.77–5.28)
RDW: 12.6 % (ref 11.7–15.4)
WBC: 8.5 10*3/uL (ref 3.4–10.8)

## 2021-02-16 ENCOUNTER — Telehealth: Payer: Self-pay | Admitting: Family

## 2021-02-16 DIAGNOSIS — M25569 Pain in unspecified knee: Secondary | ICD-10-CM

## 2021-02-16 NOTE — Telephone Encounter (Signed)
REFERRAL REQUEST Telephone Note  Have you been seen at our office for this problem? yes (Advise that they may need an appointment with their PCP before a referral can be done)  Reason for Referral: knee therapy Referral discussed with patient: yes  Best contact number of patient for referral team: [phone]    Has patient been seen by a specialist for this issue before: not sure  Patient provider preference for referral: not sure Patient location preference for referral: not sure   Patient notified that referrals can take up to a week or longer to process. If they haven't heard anything within a week they should call back and speak with the referral department.    Pt seen [facility] on last Thursday & she really needs therapy ASAP per husband.  Please call him today to let him know bc he drove back home to see Lendon Colonel bc she really needs this.

## 2021-02-18 NOTE — Telephone Encounter (Signed)
Referral to PT placed

## 2021-02-19 ENCOUNTER — Other Ambulatory Visit: Payer: Self-pay | Admitting: Family

## 2021-02-19 ENCOUNTER — Telehealth: Payer: Self-pay | Admitting: Family

## 2021-02-19 NOTE — Telephone Encounter (Signed)
Patient's husband called in and is very upset about his wife's referral for Physical Therapy.  Would like a call back today to explain why it has taken a week to get the referral put in. Please call today.

## 2021-02-19 NOTE — Telephone Encounter (Signed)
Spoke with husband he is aware homehealth pt was denied and pt for [place] was put in yesterday. He should her from them next week. He does have their number to call as well.

## 2021-02-24 ENCOUNTER — Other Ambulatory Visit: Payer: Self-pay

## 2021-02-24 ENCOUNTER — Ambulatory Visit: Payer: 59 | Attending: Family

## 2021-02-24 DIAGNOSIS — M25662 Stiffness of left knee, not elsewhere classified: Secondary | ICD-10-CM | POA: Diagnosis present

## 2021-02-24 DIAGNOSIS — M25562 Pain in left knee: Secondary | ICD-10-CM | POA: Insufficient documentation

## 2021-02-24 DIAGNOSIS — M25569 Pain in unspecified knee: Secondary | ICD-10-CM | POA: Insufficient documentation

## 2021-02-24 DIAGNOSIS — R262 Difficulty in walking, not elsewhere classified: Secondary | ICD-10-CM | POA: Insufficient documentation

## 2021-02-24 NOTE — Therapy (Signed)
[facility] [address] [place], [place], [address] Phone: [phone]   Fax:  [phone]  Physical Therapy Evaluation  Patient Details  Name: Christina [name] MRN: [id number] Date of Birth: 06/18/1960 Referring Provider (PT): [place], [place]   Encounter Date: 02/24/2021   PT End of Session - 02/24/21 0947     Visit Number 1    Number of Visits 12    Date for PT Re-Evaluation 05/14/21    PT Start Time 0950    PT Stop Time 1045    PT Time Calculation (min) 55 min    Equipment Utilized During Treatment Other (comment)   Rolling walker   Activity Tolerance Patient tolerated treatment well    Behavior During Therapy [facility] for tasks assessed/performed             Past Medical History:  Diagnosis Date   Allergy    Asthma    Constipation 10/17/2013   GERD (gastroesophageal reflux disease)    Hiatal hernia    OAB (overactive bladder) 10/17/2013   Obesity    Rectocele 10/17/2013   Sinus infection 11/11/2013   Urinary incontinence 10/17/2013   Vertigo 11/11/2013    Past Surgical History:  Procedure Laterality Date   ABDOMINAL HYSTERECTOMY     KNEE SURGERY Left     There were no vitals filed for this visit.    Subjective Assessment - 02/24/21 0945     Subjective Patient reports that she had a left knee replacement on 11/30 in [place]. She notes that she was in the hospital for about 5 days prior to being transferred to skilled nursing for about a week and a half prior to returning to [place]. She was supposed to continue physical therapy once she got back to [place]. However, she has not gotten to start therapy for about 2 weeks. She feels that she may have done something to her knee by doing her exercises at home. She notes that her knee was not painful after surgery as her calf was her primary issue. However, she notes that her knee pain has gotten worse in the last 3 days. She feels that trying to press her knee as hard as  she can to get her knee straight was the thing that caused her knee pain. She has been using a CPM machine at home for about 6 hours per day. She notes that at the skilled nursing facility they were using ice on her knee for about 2 hours and then heat on her calf.    Pertinent History Asthma    Limitations Standing;Walking    How long can you stand comfortably? <5 minutes    How long can you walk comfortably? <5 minutes    Patient Stated Goals return to work, bend down    Currently in Pain? Yes    Pain Score 8     Pain Location Knee    Pain Orientation Left    Pain Descriptors / Indicators Other (Comment)   sting   Pain Type Surgical pain    Pain Onset More than a month ago    Pain Frequency Constant    Aggravating Factors  pressing on her knee, walking, standing, stairs,    Pain Relieving Factors ice    Effect of Pain on Daily Activities difficulty walking,                [facility] PT Assessment - 02/24/21 0001       Assessment   Medical  Diagnosis Left TKA on 01/20/21    Referring Provider (PT) Lendon Colonel, FNP    Onset Date/Surgical Date 01/20/21    Next MD Visit 03/16/21    Prior Therapy Yes, SNF in December 2022      Precautions   Precautions None      Restrictions   Weight Bearing Restrictions No      Balance Screen   Has the patient fallen in the past 6 months No    Has the patient had a decrease in activity level because of a fear of falling?  No    Is the patient reluctant to leave their home because of a fear of falling?  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, wiring, lift up to 75  pounds, kneeling (frequently), carrying (50 pounds),      Cognition   Overall Cognitive Status Within Functional Limits for tasks assessed    Attention Focused    Focused Attention Appears intact    Memory Appears intact    Awareness Appears intact    Problem Solving Appears intact      Observation/Other Assessments   Observations Incision: appears to be well healing      Sensation   Light Touch Impaired by gross assessment    Additional Comments Diminished sensation along lateral knee and numbness along medial knee and calf      ROM / Strength   AROM / PROM / Strength AROM;PROM      AROM   AROM Assessment Site Knee    Right/Left Knee Left    Left Knee Extension 26   from neutral   Left Knee Flexion 67      PROM   PROM Assessment Site Knee    Right/Left Knee Left    Left Knee Extension 26   from neutral   Left Knee Flexion 73      Palpation   Patella mobility left: significant hypomobility    Palpation comment TTP: throughout left knee and quadriceps      Ambulation/Gait   Ambulation/Gait Yes    Ambulation/Gait Assistance 6: Modified independent (Device/Increase time)    Assistive device Rolling walker    Gait Pattern Step-to pattern;Decreased weight shift to left;Left flexed knee in stance;Trunk flexed;Poor foot clearance - left    Ambulation Surface Level;Indoor    Gait velocity decreased                        Objective measurements completed on examination: See above findings.                     PT Long Term Goals - 02/24/21 0947       PT LONG TERM GOAL #1   Title Patient will be independent with her HEP.    Time 4    Period Weeks    Status New    Target Date 03/24/21      PT LONG TERM GOAL #2   Title Patient will be able to demonstrate at  least 90 degrees of left knee flexion for improved mobility.    Time 4    Period Weeks    Status New    Target Date 03/24/21      PT LONG TERM GOAL #3   Title Patient will be  able to demonstrate active left knee extension within 10 degrees of neutral for improved gait mechanics.    Time 4    Period Weeks    Status New    Target Date 03/24/21      PT LONG TERM GOAL #4   Title Patient will be able to safely ambulate for at least 15 minutes with the least restrictive assistive device for improved functional mobility.    Time 4    Period Weeks    Status New    Target Date 03/24/21                    Plan - 02/24/21 0947     Clinical Impression Statement Patient is a 61 year old female presenting to physical therapy following a left TKA on 01/20/21. She presented today with high pain severity and irritability. She exhibited significant left knee stiffness which limited her mobility. Recommend that she continue with physical therapy to address her remaining impairments to [facility].    Personal Factors and Comorbidities Time since onset of injury/illness/exacerbation;Comorbidity 1;Comorbidity 2    Comorbidities OA, asthma,    Examination-Activity Limitations Locomotion Level;Transfers;Squat;Carry;Stairs;Stand;Lift    Examination-Participation Restrictions Occupation;Community Activity;Shop    Stability/Clinical Decision Making Evolving/Moderate complexity    Clinical Decision Making Moderate    Rehab Potential Poor    PT Frequency 3x / week    PT Duration 4 weeks    PT Treatment/Interventions ADLs/Self Care Home Management;Cryotherapy;Electrical Stimulation;Moist Heat;Neuromuscular re-education;Balance training;Therapeutic exercise;Therapeutic activities;Functional mobility training;Stair training;Gait training;Patient/family education;Manual techniques;Passive range of motion;Scar mobilization;Taping;Vasopneumatic Device    PT Next Visit Plan Nustep, PROM, AAROM, AROM for improved knee mobiltiy, modalities as needed    Consulted and Agree with Plan of Care Patient             Patient will benefit from skilled therapeutic  intervention in order to improve the following deficits and impairments:  Abnormal gait, Difficulty walking, Decreased range of motion, Decreased endurance, Decreased activity tolerance, Pain, Hypomobility, Impaired sensation, Decreased strength, Decreased mobility, Decreased balance  Visit Diagnosis: Stiffness of left knee, not elsewhere classified - Plan: PT plan of care cert/re-cert  Acute pain of left knee - Plan: PT plan of care cert/re-cert  Difficulty in walking, not elsewhere classified - Plan: PT plan of care cert/re-cert     Problem List Patient Active Problem List   Diagnosis Date Noted   GAD (generalized anxiety disorder) 04/30/2019   Primary osteoarthritis involving multiple joints 12/18/2017   Headache 12/18/2017   Gallbladder polyp 04/12/2017   Allergic rhinitis 05/12/2015   Left medial knee pain 05/12/2015   Vertigo 11/11/2013   Rectocele 10/17/2013   Constipation 10/17/2013   Urinary incontinence 10/17/2013   OAB (overactive bladder) 10/17/2013   Hiatal hernia    Asthma    GERD (gastroesophageal reflux disease)    Morbid obesity (H[facility]) 03/22/2013    Granville LewisJarrett C Izzy Courville, PT 02/24/2021, 12:50 PM  [facility] [address] [place], [place], [address] Phone: [phone]   Fax:  [phone]  Name: Jacqulyn CaneGracie Captain MRN: [id number] Date of Birth: 12/15/1960

## 2021-02-26 ENCOUNTER — Telehealth: Payer: Self-pay | Admitting: Family

## 2021-02-26 ENCOUNTER — Telehealth: Payer: Self-pay | Admitting: Family Medicine

## 2021-02-26 ENCOUNTER — Other Ambulatory Visit: Payer: Self-pay

## 2021-02-26 ENCOUNTER — Ambulatory Visit: Payer: 59 | Admitting: Physical Therapy

## 2021-02-26 ENCOUNTER — Encounter: Payer: Self-pay | Admitting: Physical Therapy

## 2021-02-26 DIAGNOSIS — M25662 Stiffness of left knee, not elsewhere classified: Secondary | ICD-10-CM | POA: Diagnosis not present

## 2021-02-26 DIAGNOSIS — M25562 Pain in left knee: Secondary | ICD-10-CM

## 2021-02-26 DIAGNOSIS — R262 Difficulty in walking, not elsewhere classified: Secondary | ICD-10-CM

## 2021-02-26 DIAGNOSIS — E1169 Type 2 diabetes mellitus with other specified complication: Secondary | ICD-10-CM

## 2021-02-26 MED ORDER — METFORMIN HCL ER 500 MG PO TB24: 500.0000 mg | ORAL_TABLET | Freq: Every day | ORAL | 0 refills | Status: DC

## 2021-02-26 NOTE — Telephone Encounter (Signed)
Faxed

## 2021-02-26 NOTE — Telephone Encounter (Signed)
Sending a 1 mos refill, pt has 3 days left, taking correctly, not sure what happened to Rx, knows may have to pay OOP. They have been traveling back & forth to [place]. The refill from the Nov [facility] is on file at [facility] for when it is time in February.

## 2021-02-26 NOTE — Telephone Encounter (Signed)
Rx written please fax.   Evelina Dun, FNP

## 2021-02-26 NOTE — Telephone Encounter (Signed)
Patient aware and verbalized understanding. °

## 2021-02-26 NOTE — Therapy (Signed)
[facility] [address] [place], [place], [address] Phone: [phone]   Fax:  [phone]  Physical Therapy Treatment  Patient Details  Name: Christina [name] MRN: [id number] Date of Birth: 1960/04/01 Referring Provider (PT): [place], [place]   Encounter Date: 02/26/2021   PT End of Session - 02/26/21 1043     Visit Number 2    Number of Visits 12    Date for PT Re-Evaluation 05/14/21    PT Start Time 0946    PT Stop Time 1034    PT Time Calculation (min) 48 min    Equipment Utilized During Treatment Other (comment)   FWW   Activity Tolerance Patient limited by pain    Behavior During Therapy [facility] for tasks assessed/performed;Restless             Past Medical History:  Diagnosis Date   Allergy    Asthma    Constipation 10/17/2013   GERD (gastroesophageal reflux disease)    Hiatal hernia    OAB (overactive bladder) 10/17/2013   Obesity    Rectocele 10/17/2013   Sinus infection 11/11/2013   Urinary incontinence 10/17/2013   Vertigo 11/11/2013    Past Surgical History:  Procedure Laterality Date   ABDOMINAL HYSTERECTOMY     KNEE SURGERY Left     There were no vitals filed for this visit.   Subjective Assessment - 02/26/21 0957     Subjective Inquired regarding a CPM machine. Having trouble with stairs as her front porch does not have handrails.    Pertinent History Asthma    Limitations Standing;Walking    How long can you stand comfortably? <5 minutes    How long can you walk comfortably? <5 minutes    Patient Stated Goals return to work, bend down    Currently in Pain? Yes    Pain Location Knee    Pain Orientation Left    Pain Descriptors / Indicators Discomfort    Pain Type Surgical pain    Pain Onset More than a month ago    Pain Frequency Constant                [facility] PT Assessment - 02/26/21 0001       Assessment   Medical Diagnosis Left TKA on 01/20/21    Referring Provider (PT) Lendon Colonel, FNP    Onset  Date/Surgical Date 01/20/21    Next MD Visit 03/16/21    Prior Therapy Yes, SNF in December 2022      Precautions   Precautions None      Restrictions   Weight Bearing Restrictions No                           [facility] Adult PT Treatment/Exercise - 02/26/21 0001       Exercises   Exercises Knee/Hip      Knee/Hip Exercises: Aerobic   Nustep L2, seat 6-5 x16 min      Modalities   Modalities Vasopneumatic   with wedge and folded pillow under ankle     Vasopneumatic   Number Minutes Vasopneumatic  10 minutes    Vasopnuematic Location  Knee    Vasopneumatic Pressure Low    Vasopneumatic Temperature  34/pain and edema      Manual Therapy   Manual Therapy Passive ROM;Soft tissue mobilization    Soft tissue mobilization STW to L hip adductors, medial HS    Passive ROM PROM of L knee into flexion  and extension                          PT Long Term Goals - 02/24/21 0947       PT LONG TERM GOAL #1   Title Patient will be independent with her HEP.    Time 4    Period Weeks    Status New    Target Date 03/24/21      PT LONG TERM GOAL #2   Title Patient will be able to demonstrate at least 90 degrees of left knee flexion for improved mobility.    Time 4    Period Weeks    Status New    Target Date 03/24/21      PT LONG TERM GOAL #3   Title Patient will be able to demonstrate active left knee extension within 10 degrees of neutral for improved gait mechanics.    Time 4    Period Weeks    Status New    Target Date 03/24/21      PT LONG TERM GOAL #4   Title Patient will be able to safely ambulate for at least 15 minutes with the least restrictive assistive device for improved functional mobility.    Time 4    Period Weeks    Status New    Target Date 03/24/21                   Plan - 02/26/21 1154     Clinical Impression Statement Patient presented in clinic with L knee tightness and pain. Patient observed walking with lack  of L knee extension during gait and L toe touch and push off. Patient is severely limited with L knee extension and locked in knee flexion in stance and lying in supine. Very little motion with PROM knee extension. Patient's PCP was contacted in regards to the CPM machine. Normal vasopneumatic response noted following removal of the modality.    Personal Factors and Comorbidities Time since onset of injury/illness/exacerbation;Comorbidity 1;Comorbidity 2    Comorbidities OA, asthma,    Examination-Activity Limitations Locomotion Level;Transfers;Squat;Carry;Stairs;Stand;Lift    Examination-Participation Restrictions Occupation;Community Activity;Shop    Stability/Clinical Decision Making Evolving/Moderate complexity    Rehab Potential Poor    PT Frequency 3x / week    PT Duration 4 weeks    PT Treatment/Interventions ADLs/Self Care Home Management;Cryotherapy;Electrical Stimulation;Moist Heat;Neuromuscular re-education;Balance training;Therapeutic exercise;Therapeutic activities;Functional mobility training;Stair training;Gait training;Patient/family education;Manual techniques;Passive range of motion;Scar mobilization;Taping;Vasopneumatic Device    PT Next Visit Plan Nustep, PROM, AAROM, AROM for improved knee mobiltiy, modalities as needed    Consulted and Agree with Plan of Care Patient             Patient will benefit from skilled therapeutic intervention in order to improve the following deficits and impairments:  Abnormal gait, Difficulty walking, Decreased range of motion, Decreased endurance, Decreased activity tolerance, Pain, Hypomobility, Impaired sensation, Decreased strength, Decreased mobility, Decreased balance  Visit Diagnosis: Stiffness of left knee, not elsewhere classified  Acute pain of left knee  Difficulty in walking, not elsewhere classified     Problem List Patient Active Problem List   Diagnosis Date Noted   GAD (generalized anxiety disorder) 04/30/2019    Primary osteoarthritis involving multiple joints 12/18/2017   Headache 12/18/2017   Gallbladder polyp 04/12/2017   Allergic rhinitis 05/12/2015   Left medial knee pain 05/12/2015   Vertigo 11/11/2013   Rectocele 10/17/2013   Constipation 10/17/2013   Urinary incontinence  10/17/2013   OAB (overactive bladder) 10/17/2013   Hiatal hernia    Asthma    GERD (gastroesophageal reflux disease)    Morbid obesity (HCC) 03/22/2013    Marvell FullerKelsey P Meilin Brosh, PTA 02/26/2021, 12:01 PM  [facility] [address] [place], [place], [address] Phone: [phone]   Fax:  [phone]  Name: Christina [name] MRN: [id number] Date of Birth: 12-17-1960

## 2021-03-01 ENCOUNTER — Ambulatory Visit: Payer: 59 | Admitting: Physical Therapy

## 2021-03-01 ENCOUNTER — Other Ambulatory Visit: Payer: Self-pay

## 2021-03-01 ENCOUNTER — Encounter: Payer: Self-pay | Admitting: Physical Therapy

## 2021-03-01 DIAGNOSIS — M25662 Stiffness of left knee, not elsewhere classified: Secondary | ICD-10-CM | POA: Diagnosis not present

## 2021-03-01 DIAGNOSIS — M25562 Pain in left knee: Secondary | ICD-10-CM

## 2021-03-01 DIAGNOSIS — R262 Difficulty in walking, not elsewhere classified: Secondary | ICD-10-CM

## 2021-03-01 NOTE — Therapy (Signed)
[facility] [place], [place], [address] Phone: [phone]   Fax:  [phone]  Physical Therapy Treatment  Patient Details  Name: Christina [name] MRN: [id number] Date of Birth: 12-28-1960 Referring Provider (PT): [place], [place]   Encounter Date: 03/01/2021   PT End of Session - 03/01/21 0743     Visit Number 3    Number of Visits 12    Date for PT Re-Evaluation 05/14/21    PT Start Time 0734    PT Stop Time 0819    PT Time Calculation (min) 45 min    Equipment Utilized During Treatment Other (comment)   FWW   Activity Tolerance Patient limited by pain    Behavior During Therapy [facility] for tasks assessed/performed             Past Medical History:  Diagnosis Date   Allergy    Asthma    Constipation 10/17/2013   GERD (gastroesophageal reflux disease)    Hiatal hernia    OAB (overactive bladder) 10/17/2013   Obesity    Rectocele 10/17/2013   Sinus infection 11/11/2013   Urinary incontinence 10/17/2013   Vertigo 11/11/2013    Past Surgical History:  Procedure Laterality Date   ABDOMINAL HYSTERECTOMY     KNEE SURGERY Left     There were no vitals filed for this visit.   Subjective Assessment - 03/01/21 0738     Subjective Still looking for CPM as [employer] does not carry it and neither does [employer]. Has looked online but delivery would not be for several weeks out.    Pertinent History Asthma    Limitations Standing;Walking    How long can you stand comfortably? <5 minutes    How long can you walk comfortably? <5 minutes    Patient Stated Goals return to work, bend down    Currently in Pain? Yes    Pain Score --   No pain score provided   Pain Location Knee    Pain Orientation Left    Pain Descriptors / Indicators Discomfort    Pain Type Surgical pain    Pain Onset More than a month ago    Pain Frequency Constant                [facility] PT Assessment - 03/01/21 0001       Assessment   Medical  Diagnosis Left TKA on 01/20/21    Referring Provider (PT) Lenna Gilford, FNP    Onset Date/Surgical Date 01/20/21    Next MD Visit 03/16/21    Prior Therapy Yes, SNF in December 2022      Precautions   Precautions None      ROM / Strength   AROM / PROM / Strength AROM      AROM   Overall AROM  Deficits    AROM Assessment Site Knee    Right/Left Knee Left    Left Knee Extension 83    Left Knee Flexion 37                           [facility] Adult PT Treatment/Exercise - 03/01/21 0001       Knee/Hip Exercises: Aerobic   Nustep L1, seat 6 x15 min      Modalities   Modalities Vasopneumatic      Vasopneumatic   Number Minutes Vasopneumatic  10 minutes    Vasopnuematic Location  Knee    Vasopneumatic Pressure Low  Vasopneumatic Temperature  34/pain and edema      Manual Therapy   Manual Therapy Passive ROM;Soft tissue mobilization    Soft tissue mobilization STW to L hip adductors, medial HS    Passive ROM PROM of L knee into flexion and extension                          PT Long Term Goals - 02/24/21 0947       PT LONG TERM GOAL #1   Title Patient will be independent with her HEP.    Time 4    Period Weeks    Status New    Target Date 03/24/21      PT LONG TERM GOAL #2   Title Patient will be able to demonstrate at least 90 degrees of left knee flexion for improved mobility.    Time 4    Period Weeks    Status New    Target Date 03/24/21      PT LONG TERM GOAL #3   Title Patient will be able to demonstrate active left knee extension within 10 degrees of neutral for improved gait mechanics.    Time 4    Period Weeks    Status New    Target Date 03/24/21      PT LONG TERM GOAL #4   Title Patient will be able to safely ambulate for at least 15 minutes with the least restrictive assistive device for improved functional mobility.    Time 4    Period Weeks    Status New    Target Date 03/24/21                   Plan -  03/01/21 0911     Clinical Impression Statement Patient presented in clinic with reports of continued ROM limitation. Patient has not been able to obtain a CPM at this time as they have not been avaliable at the local medical supply stores. Patient continues to have max ROM limitations but minimal improvements noted. Patient limited with PROM into extension due to pain. Palpable tightness of the L hip adductors and medial HS. Patient has reported muscle spasms as well. Normal vasopneumatic response noted with assist in more extension during modality.    Personal Factors and Comorbidities Time since onset of injury/illness/exacerbation;Comorbidity 1;Comorbidity 2    Comorbidities OA, asthma,    Examination-Activity Limitations Locomotion Level;Transfers;Squat;Carry;Stairs;Stand;Lift    Examination-Participation Restrictions Occupation;Community Activity;Shop    Stability/Clinical Decision Making Evolving/Moderate complexity    Rehab Potential Poor    PT Frequency 3x / week    PT Duration 4 weeks    PT Treatment/Interventions ADLs/Self Care Home Management;Cryotherapy;Electrical Stimulation;Moist Heat;Neuromuscular re-education;Balance training;Therapeutic exercise;Therapeutic activities;Functional mobility training;Stair training;Gait training;Patient/family education;Manual techniques;Passive range of motion;Scar mobilization;Taping;Vasopneumatic Device    PT Next Visit Plan Nustep, PROM, AAROM, AROM for improved knee mobiltiy, modalities as needed    Consulted and Agree with Plan of Care Patient             Patient will benefit from skilled therapeutic intervention in order to improve the following deficits and impairments:  Abnormal gait, Difficulty walking, Decreased range of motion, Decreased endurance, Decreased activity tolerance, Pain, Hypomobility, Impaired sensation, Decreased strength, Decreased mobility, Decreased balance  Visit Diagnosis: Stiffness of left knee, not elsewhere  classified  Acute pain of left knee  Difficulty in walking, not elsewhere classified     Problem List Patient Active Problem List   Diagnosis  Date Noted   GAD (generalized anxiety disorder) 04/30/2019   Primary osteoarthritis involving multiple joints 12/18/2017   Headache 12/18/2017   Gallbladder polyp 04/12/2017   Allergic rhinitis 05/12/2015   Left medial knee pain 05/12/2015   Vertigo 11/11/2013   Rectocele 10/17/2013   Constipation 10/17/2013   Urinary incontinence 10/17/2013   OAB (overactive bladder) 10/17/2013   Hiatal hernia    Asthma    GERD (gastroesophageal reflux disease)    Morbid obesity ([place]) 03/22/2013    Standley Brooking, PTA 03/01/2021, 9:15 AM  [facility] [place], [place], [address] Phone: [phone]   Fax:  [phone]  Name: Raiyn Bevill MRN: [id number] Date of Birth: Mar 17, 1960

## 2021-03-03 ENCOUNTER — Encounter: Payer: Self-pay | Admitting: Physical Therapy

## 2021-03-03 ENCOUNTER — Ambulatory Visit: Payer: 59 | Admitting: Physical Therapy

## 2021-03-03 ENCOUNTER — Other Ambulatory Visit: Payer: Self-pay

## 2021-03-03 DIAGNOSIS — R262 Difficulty in walking, not elsewhere classified: Secondary | ICD-10-CM

## 2021-03-03 DIAGNOSIS — M25662 Stiffness of left knee, not elsewhere classified: Secondary | ICD-10-CM

## 2021-03-03 DIAGNOSIS — M25562 Pain in left knee: Secondary | ICD-10-CM

## 2021-03-03 NOTE — Therapy (Signed)
[facility] [address] [place], [place], [address] Phone: [phone]   Fax:  [phone]  Physical Therapy Treatment  Patient Details  Name: Christina [name] MRN: [id number] Date of Birth: [birth year] Referring Provider (PT): [place], [place]   Encounter Date: 03/03/2021   PT End of Session - 03/03/21 1024     Visit Number 4    Number of Visits 12    Date for PT Re-Evaluation 05/14/21    PT Start Time 0947    PT Stop Time 1033    PT Time Calculation (min) 46 min    Equipment Utilized During Treatment Other (comment)   FWW   Activity Tolerance Patient limited by pain    Behavior During Therapy [facility] for tasks assessed/performed             Past Medical History:  Diagnosis Date   Allergy    Asthma    Constipation 10/17/2013   GERD (gastroesophageal reflux disease)    Hiatal hernia    OAB (overactive bladder) 10/17/2013   Obesity    Rectocele 10/17/2013   Sinus infection 11/11/2013   Urinary incontinence 10/17/2013   Vertigo 11/11/2013    Past Surgical History:  Procedure Laterality Date   ABDOMINAL HYSTERECTOMY     KNEE SURGERY Left     There were no vitals filed for this visit.   Subjective Assessment - 03/03/21 0956     Subjective Has been unable to obtain CPM machine.    Pertinent History Asthma    Limitations Standing;Walking    How long can you stand comfortably? <5 minutes    How long can you walk comfortably? <5 minutes    Patient Stated Goals return to work, bend down    Currently in Pain? Yes    Pain Score --   no pain score   Pain Location Knee    Pain Orientation Left    Pain Descriptors / Indicators Discomfort    Pain Type Surgical pain    Pain Onset More than a month ago    Pain Frequency Constant                [facility] PT Assessment - 03/03/21 0001       Assessment   Medical Diagnosis Left TKA on 01/20/21    Referring Provider (PT) Lendon Colonel, FNP    Onset Date/Surgical Date 01/20/21    Next MD  Visit 03/16/21    Prior Therapy Yes, SNF in December 2022      Precautions   Precautions None                           [facility] Adult PT Treatment/Exercise - 03/03/21 0001       Knee/Hip Exercises: Stretches   Passive Hamstring Stretch Left;3 reps;30 seconds    Hip Flexor Stretch Left;2 reps;30 seconds      Knee/Hip Exercises: Aerobic   Nustep L1, seat 7-6 x15 min      Modalities   Modalities Vasopneumatic      Vasopneumatic   Number Minutes Vasopneumatic  10 minutes    Vasopnuematic Location  Knee    Vasopneumatic Pressure Low    Vasopneumatic Temperature  34/pain and edema      Manual Therapy   Manual Therapy Passive ROM    Passive ROM PROM of L knee into flexion and extension  PT Long Term Goals - 02/24/21 0947       PT LONG TERM GOAL #1   Title Patient will be independent with her HEP.    Time 4    Period Weeks    Status New    Target Date 03/24/21      PT LONG TERM GOAL #2   Title Patient will be able to demonstrate at least 90 degrees of left knee flexion for improved mobility.    Time 4    Period Weeks    Status New    Target Date 03/24/21      PT LONG TERM GOAL #3   Title Patient will be able to demonstrate active left knee extension within 10 degrees of neutral for improved gait mechanics.    Time 4    Period Weeks    Status New    Target Date 03/24/21      PT LONG TERM GOAL #4   Title Patient will be able to safely ambulate for at least 15 minutes with the least restrictive assistive device for improved functional mobility.    Time 4    Period Weeks    Status New    Target Date 03/24/21                   Plan - 03/03/21 1130     Clinical Impression Statement Patient remains very limited with ROM but has been unsuccessful in obtaining a CPM. Hip stretches also attempted today to see if hip stretching will allow for more L knee ROM. Patient able to tolerate more PROM knee flexion  but knee extension is still severely limited. Normal vasopneumatic response noted following removal of the modality.    Personal Factors and Comorbidities Time since onset of injury/illness/exacerbation;Comorbidity 1;Comorbidity 2    Comorbidities OA, asthma,    Examination-Activity Limitations Locomotion Level;Transfers;Squat;Carry;Stairs;Stand;Lift    Examination-Participation Restrictions Occupation;Community Activity;Shop    Stability/Clinical Decision Making Evolving/Moderate complexity    Rehab Potential Poor    PT Frequency 3x / week    PT Duration 4 weeks    PT Treatment/Interventions ADLs/Self Care Home Management;Cryotherapy;Electrical Stimulation;Moist Heat;Neuromuscular re-education;Balance training;Therapeutic exercise;Therapeutic activities;Functional mobility training;Stair training;Gait training;Patient/family education;Manual techniques;Passive range of motion;Scar mobilization;Taping;Vasopneumatic Device    PT Next Visit Plan Nustep, PROM, AAROM, AROM for improved knee mobiltiy, modalities as needed    Consulted and Agree with Plan of Care Patient             Patient will benefit from skilled therapeutic intervention in order to improve the following deficits and impairments:  Abnormal gait, Difficulty walking, Decreased range of motion, Decreased endurance, Decreased activity tolerance, Pain, Hypomobility, Impaired sensation, Decreased strength, Decreased mobility, Decreased balance  Visit Diagnosis: Stiffness of left knee, not elsewhere classified  Acute pain of left knee  Difficulty in walking, not elsewhere classified     Problem List Patient Active Problem List   Diagnosis Date Noted   GAD (generalized anxiety disorder) 04/30/2019   Primary osteoarthritis involving multiple joints 12/18/2017   Headache 12/18/2017   Gallbladder polyp 04/12/2017   Allergic rhinitis 05/12/2015   Left medial knee pain 05/12/2015   Vertigo 11/11/2013   Rectocele 10/17/2013    Constipation 10/17/2013   Urinary incontinence 10/17/2013   OAB (overactive bladder) 10/17/2013   Hiatal hernia    Asthma    GERD (gastroesophageal reflux disease)    Morbid obesity (HCC) 03/22/2013    Marvell FullerKelsey P Letisha Yera, PTA 03/03/2021, 11:33 AM  [facility]  [address] [place], [place], [address] Phone: [phone]   Fax:  [phone]  Name: Christina [name] MRN: [id number] Date of Birth: [birth year]

## 2021-03-05 ENCOUNTER — Other Ambulatory Visit: Payer: Self-pay

## 2021-03-05 ENCOUNTER — Ambulatory Visit: Payer: 59

## 2021-03-05 DIAGNOSIS — M25662 Stiffness of left knee, not elsewhere classified: Secondary | ICD-10-CM

## 2021-03-05 DIAGNOSIS — M25562 Pain in left knee: Secondary | ICD-10-CM

## 2021-03-05 DIAGNOSIS — R262 Difficulty in walking, not elsewhere classified: Secondary | ICD-10-CM

## 2021-03-05 NOTE — Therapy (Signed)
[facility] [address] [place], [place], [address] Phone: [phone]   Fax:  [phone]  Physical Therapy Treatment  Patient Details  Name: Christina [name] MRN: [id number] Date of Birth: 1960/10/26 Referring Provider (PT): [place], [place]   Encounter Date: 03/05/2021   PT End of Session - 03/05/21 0939     Visit Number 5    Number of Visits 12    Date for PT Re-Evaluation 05/14/21    PT Start Time 0945    PT Stop Time 1103    PT Time Calculation (min) 78 min    Equipment Utilized During Treatment Other (comment)   FWW   Activity Tolerance Patient limited by pain    Behavior During Therapy [facility] for tasks assessed/performed             Past Medical History:  Diagnosis Date   Allergy    Asthma    Constipation 10/17/2013   GERD (gastroesophageal reflux disease)    Hiatal hernia    OAB (overactive bladder) 10/17/2013   Obesity    Rectocele 10/17/2013   Sinus infection 11/11/2013   Urinary incontinence 10/17/2013   Vertigo 11/11/2013    Past Surgical History:  Procedure Laterality Date   ABDOMINAL HYSTERECTOMY     KNEE SURGERY Left     There were no vitals filed for this visit.   Subjective Assessment - 03/05/21 0944     Subjective Patient reports that her knee has been giving her some problems today as it is hurting quite a bit.    Pertinent History Asthma    Limitations Standing;Walking    How long can you stand comfortably? <5 minutes    How long can you walk comfortably? <5 minutes    Patient Stated Goals return to work, bend down    Currently in Pain? Yes    Pain Location Knee    Pain Orientation Left    Pain Type Surgical pain    Pain Onset More than a month ago                               [facility] Adult PT Treatment/Exercise - 03/05/21 0001       Knee/Hip Exercises: Stretches   Passive Hamstring Stretch Left;4 reps;30 seconds    Hip Flexor Stretch Left;60 seconds;2 reps      Knee/Hip  Exercises: Aerobic   Nustep L3, seat 8 x15 min      Knee/Hip Exercises: Standing   Hip Flexion Left;20 reps;Knee bent    Forward Lunges Left   with left foot on 6" step; 3 minutes   Rocker Board 2 minutes   LLE only on rocker board; 5 second hold     Modalities   Modalities Vasopneumatic      Vasopneumatic   Number Minutes Vasopneumatic  15 minutes    Vasopnuematic Location  Knee    Vasopneumatic Pressure Low    Vasopneumatic Temperature  34      Manual Therapy   Manual Therapy Joint mobilization;Soft tissue mobilization;Passive ROM    Joint Mobilization Tibiofemoral grade I-III for improved extension    Soft tissue mobilization left quadriceps, hamstrings, and IT band    Passive ROM PROM of L knee into flexion and extension                          PT Long Term Goals - 02/24/21 1505  PT LONG TERM GOAL #1   Title Patient will be independent with her HEP.    Time 4    Period Weeks    Status New    Target Date 03/24/21      PT LONG TERM GOAL #2   Title Patient will be able to demonstrate at least 90 degrees of left knee flexion for improved mobility.    Time 4    Period Weeks    Status New    Target Date 03/24/21      PT LONG TERM GOAL #3   Title Patient will be able to demonstrate active left knee extension within 10 degrees of neutral for improved gait mechanics.    Time 4    Period Weeks    Status New    Target Date 03/24/21      PT LONG TERM GOAL #4   Title Patient will be able to safely ambulate for at least 15 minutes with the least restrictive assistive device for improved functional mobility.    Time 4    Period Weeks    Status New    Target Date 03/24/21                   Plan - 03/05/21 7989     Clinical Impression Statement Treatment focused on improved knee mobility through the use of manual therapy in addition to active and active assisted interventions. She required minimal cuing with forward lunges onto a 6" step  for knee flexion to facilitate improved mobility. Manual therapy focused on tibiofemoral joint mobilizations followed by PROM with overpressure to facilitate improved knee mobilty with fair results. She reported feeling alright, but still sore upon the conclusion of treatment. She continues to require skilled physical therapy to address her remaining impairments to maximize her functional mobility.    Personal Factors and Comorbidities Time since onset of injury/illness/exacerbation;Comorbidity 1;Comorbidity 2    Comorbidities OA, asthma,    Examination-Activity Limitations Locomotion Level;Transfers;Squat;Carry;Stairs;Stand;Lift    Examination-Participation Restrictions Occupation;Community Activity;Shop    Stability/Clinical Decision Making Evolving/Moderate complexity    Rehab Potential Poor    PT Frequency 3x / week    PT Duration 4 weeks    PT Treatment/Interventions ADLs/Self Care Home Management;Cryotherapy;Electrical Stimulation;Moist Heat;Neuromuscular re-education;Balance training;Therapeutic exercise;Therapeutic activities;Functional mobility training;Stair training;Gait training;Patient/family education;Manual techniques;Passive range of motion;Scar mobilization;Taping;Vasopneumatic Device    PT Next Visit Plan Nustep, PROM, AAROM, AROM for improved knee mobiltiy, modalities as needed    Consulted and Agree with Plan of Care Patient             Patient will benefit from skilled therapeutic intervention in order to improve the following deficits and impairments:  Abnormal gait, Difficulty walking, Decreased range of motion, Decreased endurance, Decreased activity tolerance, Pain, Hypomobility, Impaired sensation, Decreased strength, Decreased mobility, Decreased balance  Visit Diagnosis: Stiffness of left knee, not elsewhere classified  Acute pain of left knee  Difficulty in walking, not elsewhere classified     Problem List Patient Active Problem List   Diagnosis Date  Noted   GAD (generalized anxiety disorder) 04/30/2019   Primary osteoarthritis involving multiple joints 12/18/2017   Headache 12/18/2017   Gallbladder polyp 04/12/2017   Allergic rhinitis 05/12/2015   Left medial knee pain 05/12/2015   Vertigo 11/11/2013   Rectocele 10/17/2013   Constipation 10/17/2013   Urinary incontinence 10/17/2013   OAB (overactive bladder) 10/17/2013   Hiatal hernia    Asthma    GERD (gastroesophageal reflux disease)    Morbid  obesity (HCC) 03/22/2013    [place] LewisJarrett C Nusaybah Ivie, PT 03/05/2021, 12:25 PM  [facility] [address] [place], [place], [address] Phone: [phone]   Fax:  [phone]  Name: Christina [name] MRN: [id number] Date of Birth: [birth year]

## 2021-03-08 ENCOUNTER — Encounter: Payer: 59 | Admitting: Physical Therapy

## 2021-03-09 ENCOUNTER — Other Ambulatory Visit: Payer: Self-pay

## 2021-03-09 ENCOUNTER — Ambulatory Visit: Payer: 59 | Admitting: Physical Therapy

## 2021-03-09 ENCOUNTER — Encounter: Payer: Self-pay | Admitting: Physical Therapy

## 2021-03-09 DIAGNOSIS — R262 Difficulty in walking, not elsewhere classified: Secondary | ICD-10-CM

## 2021-03-09 DIAGNOSIS — M25662 Stiffness of left knee, not elsewhere classified: Secondary | ICD-10-CM

## 2021-03-09 DIAGNOSIS — M25562 Pain in left knee: Secondary | ICD-10-CM

## 2021-03-09 NOTE — Therapy (Addendum)
[facility] [address] [place], [place], [address] Phone: [phone]   Fax:  [phone]  Physical Therapy Treatment  Patient Details  Name: Christina [name] MRN: [id number] Date of Birth: 1960-05-14 Referring Provider (PT): [place], [place]   Encounter Date: 03/09/2021   PT End of Session - 03/09/21 0858     Visit Number 6    Number of Visits 12    Date for PT Re-Evaluation 05/14/21    PT Start Time 0903    PT Stop Time 0955    PT Time Calculation (min) 52 min    Equipment Utilized During Treatment Other (comment)   FWW   Activity Tolerance Patient limited by pain    Behavior During Therapy [facility] for tasks assessed/performed             Past Medical History:  Diagnosis Date   Allergy    Asthma    Constipation 10/17/2013   GERD (gastroesophageal reflux disease)    Hiatal hernia    OAB (overactive bladder) 10/17/2013   Obesity    Rectocele 10/17/2013   Sinus infection 11/11/2013   Urinary incontinence 10/17/2013   Vertigo 11/11/2013    Past Surgical History:  Procedure Laterality Date   ABDOMINAL HYSTERECTOMY     KNEE SURGERY Left     There were no vitals filed for this visit.   Subjective Assessment - 03/09/21 0858     Subjective Reports that she feels very sore today.    Pertinent History Asthma    Limitations Standing;Walking    How long can you stand comfortably? <5 minutes    How long can you walk comfortably? <5 minutes    Patient Stated Goals return to work, bend down    Currently in Pain? Yes    Pain Score 7     Pain Location Knee    Pain Orientation Left    Pain Descriptors / Indicators Sore    Pain Type Surgical pain    Pain Onset More than a month ago    Pain Frequency Constant                [facility] PT Assessment - 03/09/21 0001       Assessment   Medical Diagnosis Left TKA on 01/20/21    Referring Provider (PT) Lendon ColonelHawks, FNP    Onset Date/Surgical Date 01/20/21    Next MD Visit 03/16/21    Prior  Therapy Yes, SNF in December 2022      Precautions   Precautions None      Restrictions   Weight Bearing Restrictions No                           [facility] Adult PT Treatment/Exercise - 03/09/21 0001       Knee/Hip Exercises: Aerobic   Nustep L3, seat 8 x15 min      Knee/Hip Exercises: Standing   Forward Lunges Left;15 reps;3 seconds    Step Down Left;15 reps;Hand Hold: 2;Step Height: 4"    Rocker Board 5 minutes      Modalities   Modalities Vasopneumatic      Vasopneumatic   Number Minutes Vasopneumatic  10 minutes    Vasopnuematic Location  Knee    Vasopneumatic Pressure Medium    Vasopneumatic Temperature  34      Manual Therapy   Manual Therapy Passive ROM    Passive ROM PROM of L knee into flexion and extension  PT Long Term Goals - 03/09/21 1037       PT LONG TERM GOAL #1   Title Patient will be independent with her HEP.    Time 4    Period Weeks    Status On-going    Target Date 03/24/21      PT LONG TERM GOAL #2   Title Patient will be able to demonstrate at least 90 degrees of left knee flexion for improved mobility.    Time 4    Period Weeks    Status On-going    Target Date 03/24/21      PT LONG TERM GOAL #3   Title Patient will be able to demonstrate active left knee extension within 10 degrees of neutral for improved gait mechanics.    Time 4    Period Weeks    Status On-going    Target Date 03/24/21      PT LONG TERM GOAL #4   Title Patient will be able to safely ambulate for at least 15 minutes with the least restrictive assistive device for improved functional mobility.    Time 4    Period Weeks    Status On-going    Target Date 03/24/21                   Plan - 03/09/21 0946     Clinical Impression Statement Patient presented in clinic with reports of greater soreness as she purchased a device to work on ROM. Patient progressed with active stretching and ROM exercises  reports of tightness. Patient concerned as to what next plan is as there is very little progress with knee extension. Patient states that the device that she bought makes her quesy due to pain. Normal vasopnuematic response noted following removal of the modality.    Personal Factors and Comorbidities Time since onset of injury/illness/exacerbation;Comorbidity 1;Comorbidity 2    Comorbidities OA, asthma,    Examination-Activity Limitations Locomotion Level;Transfers;Squat;Carry;Stairs;Stand;Lift    Examination-Participation Restrictions Occupation;Community Activity;Shop    Stability/Clinical Decision Making Evolving/Moderate complexity    Rehab Potential Poor    PT Frequency 3x / week    PT Duration 4 weeks    PT Treatment/Interventions ADLs/Self Care Home Management;Cryotherapy;Electrical Stimulation;Moist Heat;Neuromuscular re-education;Balance training;Therapeutic exercise;Therapeutic activities;Functional mobility training;Stair training;Gait training;Patient/family education;Manual techniques;Passive range of motion;Scar mobilization;Taping;Vasopneumatic Device    PT Next Visit Plan Nustep, PROM, AAROM, AROM for improved knee mobiltiy, modalities as needed    Consulted and Agree with Plan of Care Patient             Patient will benefit from skilled therapeutic intervention in order to improve the following deficits and impairments:  Abnormal gait, Difficulty walking, Decreased range of motion, Decreased endurance, Decreased activity tolerance, Pain, Hypomobility, Impaired sensation, Decreased strength, Decreased mobility, Decreased balance  Visit Diagnosis: Stiffness of left knee, not elsewhere classified  Acute pain of left knee  Difficulty in walking, not elsewhere classified     Problem List Patient Active Problem List   Diagnosis Date Noted   GAD (generalized anxiety disorder) 04/30/2019   Primary osteoarthritis involving multiple joints 12/18/2017   Headache 12/18/2017    Gallbladder polyp 04/12/2017   Allergic rhinitis 05/12/2015   Left medial knee pain 05/12/2015   Vertigo 11/11/2013   Rectocele 10/17/2013   Constipation 10/17/2013   Urinary incontinence 10/17/2013   OAB (overactive bladder) 10/17/2013   Hiatal hernia    Asthma    GERD (gastroesophageal reflux disease)    Morbid obesity (HCC) 03/22/2013  Marvell Fuller, PTA 03/09/2021, 10:37 AM  [facility] [address] [place], [place], [address] Phone: [phone]   Fax:  [phone]  Name: Christina [name] MRN: [id number] Date of Birth: 1961-02-21

## 2021-03-10 ENCOUNTER — Other Ambulatory Visit: Payer: Self-pay

## 2021-03-10 ENCOUNTER — Ambulatory Visit: Payer: 59 | Admitting: Physical Therapy

## 2021-03-10 ENCOUNTER — Encounter: Payer: Self-pay | Admitting: Physical Therapy

## 2021-03-10 DIAGNOSIS — M25662 Stiffness of left knee, not elsewhere classified: Secondary | ICD-10-CM | POA: Diagnosis not present

## 2021-03-10 DIAGNOSIS — R262 Difficulty in walking, not elsewhere classified: Secondary | ICD-10-CM

## 2021-03-10 DIAGNOSIS — M25562 Pain in left knee: Secondary | ICD-10-CM

## 2021-03-10 NOTE — Therapy (Signed)
[facility] [address] [place], [place], [address] Phone: [phone]   Fax:  [phone]  Physical Therapy Treatment  Patient Details  Name: Christina [name] MRN: [id number] Date of Birth: [birth year] Referring Provider (PT): [place], [place]   Encounter Date: 03/10/2021   PT End of Session - 03/10/21 0911     Visit Number 7    Number of Visits 12    Date for PT Re-Evaluation 05/14/21    PT Start Time 0903    PT Stop Time 0957    PT Time Calculation (min) 54 min    Activity Tolerance Patient limited by pain    Behavior During Therapy [facility] for tasks assessed/performed             Past Medical History:  Diagnosis Date   Allergy    Asthma    Constipation 10/17/2013   GERD (gastroesophageal reflux disease)    Hiatal hernia    OAB (overactive bladder) 10/17/2013   Obesity    Rectocele 10/17/2013   Sinus infection 11/11/2013   Urinary incontinence 10/17/2013   Vertigo 11/11/2013    Past Surgical History:  Procedure Laterality Date   ABDOMINAL HYSTERECTOMY     KNEE SURGERY Left     There were no vitals filed for this visit.   Subjective Assessment - 03/10/21 0911     Subjective Reports that she feels very sore today.    Pertinent History Asthma    Limitations Standing;Walking    How long can you stand comfortably? <5 minutes    How long can you walk comfortably? <5 minutes    Patient Stated Goals return to work, bend down    Currently in Pain? Yes    Pain Score 6     Pain Location Knee    Pain Orientation Left    Pain Descriptors / Indicators Sore;Discomfort    Pain Type Surgical pain    Pain Onset More than a month ago    Pain Frequency Constant                [facility] PT Assessment - 03/10/21 0001       Assessment   Medical Diagnosis Left TKA on 01/20/21    Referring Provider (PT) Lendon ColonelHawks, FNP    Onset Date/Surgical Date 01/20/21    Next MD Visit 03/16/21    Prior Therapy Yes, SNF in December 2022       Precautions   Precautions None      ROM / Strength   AROM / PROM / Strength AROM      AROM   Overall AROM  Deficits    AROM Assessment Site Knee    Right/Left Knee Left    Left Knee Extension 30    Left Knee Flexion 80                           [facility] Adult PT Treatment/Exercise - 03/10/21 0001       Knee/Hip Exercises: Aerobic   Nustep L5, seat 7 x15 min      Modalities   Modalities Vasopneumatic      Vasopneumatic   Number Minutes Vasopneumatic  10 minutes    Vasopnuematic Location  Knee    Vasopneumatic Pressure Medium    Vasopneumatic Temperature  34      Manual Therapy   Manual Therapy Passive ROM;Joint mobilization    Joint Mobilization Tibiofemoral grade I-III A/P for improved extension and flexion  Passive ROM PROM of L knee into flexion and extension                          PT Long Term Goals - 03/09/21 1037       PT LONG TERM GOAL #1   Title Patient will be independent with her HEP.    Time 4    Period Weeks    Status On-going    Target Date 03/24/21      PT LONG TERM GOAL #2   Title Patient will be able to demonstrate at least 90 degrees of left knee flexion for improved mobility.    Time 4    Period Weeks    Status On-going    Target Date 03/24/21      PT LONG TERM GOAL #3   Title Patient will be able to demonstrate active left knee extension within 10 degrees of neutral for improved gait mechanics.    Time 4    Period Weeks    Status On-going    Target Date 03/24/21      PT LONG TERM GOAL #4   Title Patient will be able to safely ambulate for at least 15 minutes with the least restrictive assistive device for improved functional mobility.    Time 4    Period Weeks    Status On-going    Target Date 03/24/21                   Plan - 03/10/21 1016     Clinical Impression Statement Patient presented in clinic with reports of discomfort and nausea from pain. Patient progressed in seat on nustep  and resistance today. Patient states that she has been doing a prone hang at home to attempt to improve ROM. Patient continues to have very limited ROM even with the tibiofemoral mobilizations and PROM. L HS are palpably tight with little to no release with manual therapy. AROM of L knee measured as 30-80 deg today. Continues to use FWW for gait due to the limited stance from limited ROM. Normal vasopneumatic response noted following removal of the modality.    Personal Factors and Comorbidities Time since onset of injury/illness/exacerbation;Comorbidity 1;Comorbidity 2    Comorbidities OA, asthma,    Examination-Activity Limitations Locomotion Level;Transfers;Squat;Carry;Stairs;Stand;Lift    Examination-Participation Restrictions Occupation;Community Activity;Shop    Stability/Clinical Decision Making Evolving/Moderate complexity    Rehab Potential Poor    PT Frequency 3x / week    PT Duration 4 weeks    PT Treatment/Interventions ADLs/Self Care Home Management;Cryotherapy;Electrical Stimulation;Moist Heat;Neuromuscular re-education;Balance training;Therapeutic exercise;Therapeutic activities;Functional mobility training;Stair training;Gait training;Patient/family education;Manual techniques;Passive range of motion;Scar mobilization;Taping;Vasopneumatic Device    PT Next Visit Plan Nustep, PROM, AAROM, AROM for improved knee mobiltiy, modalities as needed    Consulted and Agree with Plan of Care Patient             Patient will benefit from skilled therapeutic intervention in order to improve the following deficits and impairments:  Abnormal gait, Difficulty walking, Decreased range of motion, Decreased endurance, Decreased activity tolerance, Pain, Hypomobility, Impaired sensation, Decreased strength, Decreased mobility, Decreased balance  Visit Diagnosis: Stiffness of left knee, not elsewhere classified  Acute pain of left knee  Difficulty in walking, not elsewhere  classified     Problem List Patient Active Problem List   Diagnosis Date Noted   GAD (generalized anxiety disorder) 04/30/2019   Primary osteoarthritis involving multiple joints 12/18/2017   Headache 12/18/2017  Gallbladder polyp 04/12/2017   Allergic rhinitis 05/12/2015   Left medial knee pain 05/12/2015   Vertigo 11/11/2013   Rectocele 10/17/2013   Constipation 10/17/2013   Urinary incontinence 10/17/2013   OAB (overactive bladder) 10/17/2013   Hiatal hernia    Asthma    GERD (gastroesophageal reflux disease)    Morbid obesity (HCC) 03/22/2013    Marvell Fuller, PTA 03/10/2021, 10:20 AM  [facility] [address] [place], [place], [address] Phone: [phone]   Fax:  [phone]  Name: Christina [name] MRN: [id number] Date of Birth: [birth year]

## 2021-03-11 ENCOUNTER — Encounter: Payer: 59 | Admitting: Physical Therapy

## 2021-03-12 ENCOUNTER — Encounter: Payer: Self-pay | Admitting: Physical Therapy

## 2021-03-12 ENCOUNTER — Ambulatory Visit: Payer: 59 | Admitting: Physical Therapy

## 2021-03-12 ENCOUNTER — Other Ambulatory Visit: Payer: Self-pay

## 2021-03-12 DIAGNOSIS — M25562 Pain in left knee: Secondary | ICD-10-CM

## 2021-03-12 DIAGNOSIS — M25662 Stiffness of left knee, not elsewhere classified: Secondary | ICD-10-CM

## 2021-03-12 DIAGNOSIS — R262 Difficulty in walking, not elsewhere classified: Secondary | ICD-10-CM

## 2021-03-12 NOTE — Therapy (Signed)
[facility] [place], [place], [address] Phone: [phone]   Fax:  [phone]  Physical Therapy Treatment  Patient Details  Name: Christina [name] MRN: [id number] Date of Birth: January 31, 1961 Referring Provider (PT): Plato, [place]   Encounter Date: 03/12/2021   PT End of Session - 03/12/21 0902     Visit Number 8    Number of Visits 12    Date for PT Re-Evaluation 05/14/21    PT Start Time 0904    PT Stop Time 0950    PT Time Calculation (min) 46 min    Equipment Utilized During Treatment Other (comment)   FWW   Activity Tolerance Patient limited by pain    Behavior During Therapy [facility] for tasks assessed/performed             Past Medical History:  Diagnosis Date   Allergy    Asthma    Constipation 10/17/2013   GERD (gastroesophageal reflux disease)    Hiatal hernia    OAB (overactive bladder) 10/17/2013   Obesity    Rectocele 10/17/2013   Sinus infection 11/11/2013   Urinary incontinence 10/17/2013   Vertigo 11/11/2013    Past Surgical History:  Procedure Laterality Date   ABDOMINAL HYSTERECTOMY     KNEE SURGERY Left     There were no vitals filed for this visit.   Subjective Assessment - 03/12/21 0902     Subjective To return to MD in [place].    Pertinent History Asthma    Limitations Standing;Walking    How long can you stand comfortably? <5 minutes    How long can you walk comfortably? <5 minutes    Patient Stated Goals return to work, bend down    Currently in Pain? Yes    Pain Score 4     Pain Location Knee    Pain Orientation Left    Pain Descriptors / Indicators Discomfort;Sore    Pain Type Surgical pain    Pain Onset More than a month ago    Pain Frequency Constant                [facility] PT Assessment - 03/12/21 0001       Assessment   Medical Diagnosis Left TKA on 01/20/21    Referring Provider (PT) Lenna Gilford, FNP    Onset Date/Surgical Date 01/20/21    Next MD Visit 03/16/21    Prior  Therapy Yes, SNF in December 2022      Precautions   Precautions None                           [facility] Adult PT Treatment/Exercise - 03/12/21 0001       Knee/Hip Exercises: Aerobic   Nustep L5, seat 8-7 x16 min      Knee/Hip Exercises: Standing   Forward Lunges Left;20 reps;5 seconds;Limitations    Rocker Board 4 minutes      Modalities   Modalities Vasopneumatic      Vasopneumatic   Number Minutes Vasopneumatic  10 minutes    Vasopnuematic Location  Knee    Vasopneumatic Pressure Medium    Vasopneumatic Temperature  34      Manual Therapy   Manual Therapy Passive ROM    Passive ROM PROM of L knee into flexion and extension                          PT Long  Term Goals - 03/09/21 1037       PT LONG TERM GOAL #1   Title Patient will be independent with her HEP.    Time 4    Period Weeks    Status On-going    Target Date 03/24/21      PT LONG TERM GOAL #2   Title Patient will be able to demonstrate at least 90 degrees of left knee flexion for improved mobility.    Time 4    Period Weeks    Status On-going    Target Date 03/24/21      PT LONG TERM GOAL #3   Title Patient will be able to demonstrate active left knee extension within 10 degrees of neutral for improved gait mechanics.    Time 4    Period Weeks    Status On-going    Target Date 03/24/21      PT LONG TERM GOAL #4   Title Patient will be able to safely ambulate for at least 15 minutes with the least restrictive assistive device for improved functional mobility.    Time 4    Period Weeks    Status On-going    Target Date 03/24/21                   Plan - 03/12/21 0948     Clinical Impression Statement Patient presented in clinic with reports of continued pain and tightness. Some relaxation of the L calf noted by patient but has a prior injury to L calf. Patient unable to walk with heel strike due to calf tightness. AROM of L knee measured as 35-80 deg.  Patient has been progressed through PROM, joint mobilizations and stretching in attempts to increase ROM. Normal vasopnuematic response noted following removal of the modality.    Personal Factors and Comorbidities Time since onset of injury/illness/exacerbation;Comorbidity 1;Comorbidity 2    Comorbidities OA, asthma,    Examination-Activity Limitations Locomotion Level;Transfers;Squat;Carry;Stairs;Stand;Lift    Examination-Participation Restrictions Occupation;Community Activity;Shop    Stability/Clinical Decision Making Evolving/Moderate complexity    Rehab Potential Poor    PT Frequency 3x / week    PT Duration 4 weeks    PT Treatment/Interventions ADLs/Self Care Home Management;Cryotherapy;Electrical Stimulation;Moist Heat;Neuromuscular re-education;Balance training;Therapeutic exercise;Therapeutic activities;Functional mobility training;Stair training;Gait training;Patient/family education;Manual techniques;Passive range of motion;Scar mobilization;Taping;Vasopneumatic Device    PT Next Visit Plan Consult with surgeon.    Consulted and Agree with Plan of Care Patient             Patient will benefit from skilled therapeutic intervention in order to improve the following deficits and impairments:  Abnormal gait, Difficulty walking, Decreased range of motion, Decreased endurance, Decreased activity tolerance, Pain, Hypomobility, Impaired sensation, Decreased strength, Decreased mobility, Decreased balance  Visit Diagnosis: Stiffness of left knee, not elsewhere classified  Acute pain of left knee  Difficulty in walking, not elsewhere classified     Problem List Patient Active Problem List   Diagnosis Date Noted   GAD (generalized anxiety disorder) 04/30/2019   Primary osteoarthritis involving multiple joints 12/18/2017   Headache 12/18/2017   Gallbladder polyp 04/12/2017   Allergic rhinitis 05/12/2015   Left medial knee pain 05/12/2015   Vertigo 11/11/2013   Rectocele  10/17/2013   Constipation 10/17/2013   Urinary incontinence 10/17/2013   OAB (overactive bladder) 10/17/2013   Hiatal hernia    Asthma    GERD (gastroesophageal reflux disease)    Morbid obesity ([place]) 03/22/2013    Elizebeth Koller Deborahann Poteat, PTA 03/12/2021, 9:54 AM  [facility] [place], [place], [address] Phone: [phone]   Fax:  [phone]  Name: Christina [name] MRN: [id number] Date of Birth: [birth year]

## 2021-03-15 ENCOUNTER — Other Ambulatory Visit: Payer: Self-pay | Admitting: Family

## 2021-05-26 ENCOUNTER — Ambulatory Visit (INDEPENDENT_AMBULATORY_CARE_PROVIDER_SITE_OTHER): Payer: 59 | Admitting: Family

## 2021-05-26 ENCOUNTER — Encounter: Payer: Self-pay | Admitting: Family

## 2021-05-26 VITALS — BP 111/73 | HR 105 | Temp 97.2°F | Ht 63.0 in | Wt 218.0 lb

## 2021-05-26 DIAGNOSIS — N3281 Overactive bladder: Secondary | ICD-10-CM

## 2021-05-26 DIAGNOSIS — E785 Hyperlipidemia, unspecified: Secondary | ICD-10-CM

## 2021-05-26 DIAGNOSIS — M159 Polyosteoarthritis, unspecified: Secondary | ICD-10-CM | POA: Diagnosis not present

## 2021-05-26 DIAGNOSIS — J4530 Mild persistent asthma, uncomplicated: Secondary | ICD-10-CM | POA: Diagnosis not present

## 2021-05-26 DIAGNOSIS — R7303 Prediabetes: Secondary | ICD-10-CM | POA: Insufficient documentation

## 2021-05-26 DIAGNOSIS — F411 Generalized anxiety disorder: Secondary | ICD-10-CM

## 2021-05-26 DIAGNOSIS — K5900 Constipation, unspecified: Secondary | ICD-10-CM

## 2021-05-26 DIAGNOSIS — E1169 Type 2 diabetes mellitus with other specified complication: Secondary | ICD-10-CM

## 2021-05-26 DIAGNOSIS — K219 Gastro-esophageal reflux disease without esophagitis: Secondary | ICD-10-CM

## 2021-05-26 LAB — BAYER DCA HB A1C WAIVED: HB A1C (BAYER DCA - WAIVED): 6.4 % — ABNORMAL HIGH (ref 4.8–5.6)

## 2021-05-26 MED ORDER — OMEPRAZOLE 40 MG PO CPDR: DELAYED_RELEASE_CAPSULE | ORAL | 1 refills | Status: AC

## 2021-05-26 MED ORDER — BUSPIRONE HCL 5 MG PO TABS: 5.0000 mg | ORAL_TABLET | Freq: Two times a day (BID) | ORAL | 2 refills | Status: AC

## 2021-05-26 MED ORDER — ATORVASTATIN CALCIUM 10 MG PO TABS: 10.0000 mg | ORAL_TABLET | Freq: Every day | ORAL | 1 refills | Status: AC

## 2021-05-26 MED ORDER — CETIRIZINE HCL 10 MG PO TABS: 10.0000 mg | ORAL_TABLET | Freq: Every day | ORAL | 1 refills | Status: AC

## 2021-05-26 MED ORDER — MONTELUKAST SODIUM 10 MG PO TABS: 10.0000 mg | ORAL_TABLET | Freq: Every day | ORAL | 1 refills | Status: DC

## 2021-05-26 MED ORDER — ACCU-CHEK SOFTCLIX LANCETS MISC: 12 refills | Status: AC

## 2021-05-26 MED ORDER — METFORMIN HCL ER 500 MG PO TB24: 500.0000 mg | ORAL_TABLET | Freq: Every day | ORAL | 0 refills | Status: DC

## 2021-05-26 MED ORDER — CYCLOBENZAPRINE HCL 10 MG PO TABS: 10.0000 mg | ORAL_TABLET | Freq: Three times a day (TID) | ORAL | 3 refills | Status: AC | PRN

## 2021-05-26 MED ORDER — GLUCOSE BLOOD VI STRP: ORAL_STRIP | 12 refills | Status: AC

## 2021-05-26 NOTE — Progress Notes (Signed)
? ?Subjective:  ? ? Patient ID: Christina [name], female    DOB: 01/10/1961, 61 y.o.   MRN: [id number] ? ?Chief Complaint  ?Patient presents with  ? Medical Management of Chronic Issues  ? ?PT presents to the office today for chronic follow up. She had a total knee replacement on 01/20/21 and then had a manipulation 03/24/21 and 04/21/21. She is scheduled for another manipulation on 06/02/21 because she only ROM of 23 degrees. She continues to use a walker and having pain of 7-8 out 10 when using PT. When just walking a 5 out 10.  ? ?She is having her surgery in [place]. ? ?Knee Pain  ?The incident occurred more than 1 week ago. The pain is present in the left knee. The pain is moderate.  ?Asthma ?There is no cough or wheezing. This is a chronic problem. The current episode started more than 1 year ago. The problem occurs intermittently. The problem has been waxing and waning. Associated symptoms include heartburn. Her symptoms are alleviated by rest. She reports moderate improvement on treatment. Her past medical history is significant for asthma.  ?Gastroesophageal Reflux ?She complains of belching and heartburn. She reports no coughing or no wheezing. This is a chronic problem. The current episode started more than 1 year ago. The problem occurs occasionally. The problem has been waxing and waning. The symptoms are aggravated by certain foods. Risk factors include obesity. She has tried a PPI for the symptoms. The treatment provided moderate relief.  ?Arthritis ?Presents for follow-up visit. She complains of pain and stiffness. Affected locations include the right knee and left knee. Her pain is at a severity of 5/10.  ?Urinary Frequency  ?This is a chronic problem. The current episode started more than 1 year ago. The problem occurs intermittently. The problem has been waxing and waning. The pain is at a severity of 0/10. The patient is experiencing no pain. Associated symptoms include frequency and urgency. She has  tried increased fluids for the symptoms. The treatment provided moderate relief.  ?Anxiety ?Presents for follow-up visit. Symptoms include depressed mood, excessive worry, irritability and nervous/anxious behavior. Symptoms occur occasionally. The severity of symptoms is moderate.  ? ?Her past medical history is significant for asthma.  ?Constipation ?This is a chronic problem. The current episode started more than 1 year ago. The problem has been waxing and waning since onset. Her stool frequency is 2 to 3 times per week. Risk factors include obesity. She has tried stool softeners for the symptoms. The treatment provided mild relief.  ?Diabetes ?She presents for her follow-up diabetic visit. She has type 2 diabetes mellitus. Hypoglycemia symptoms include nervousness/anxiousness. Pertinent negatives for diabetes include no blurred vision and no foot paresthesias. Symptoms are stable. Diabetic complications include heart disease. Risk factors for coronary artery disease include dyslipidemia, hypertension, sedentary lifestyle and post-menopausal.  ? ? ? ?Review of Systems  ?Constitutional:  Positive for irritability.  ?Eyes:  Negative for blurred vision.  ?Respiratory:  Negative for cough and wheezing.   ?Gastrointestinal:  Positive for constipation and heartburn.  ?Genitourinary:  Positive for frequency and urgency.  ?Musculoskeletal:  Positive for arthritis and stiffness.  ?Psychiatric/Behavioral:  The patient is nervous/anxious.   ?All other systems reviewed and are negative. ? ?   ?Objective:  ? Physical Exam ?Vitals reviewed.  ?Constitutional:   ?   General: She is not in acute distress. ?   Appearance: She is well-developed. She is obese.  ?HENT:  ?   Head: Normocephalic and  atraumatic.  ?   Right Ear: Tympanic membrane normal.  ?   Left Ear: Tympanic membrane normal.  ?Eyes:  ?   Pupils: Pupils are equal, round, and reactive to light.  ?Neck:  ?   Thyroid: No thyromegaly.  ?Cardiovascular:  ?   Rate and Rhythm:  Normal rate and regular rhythm.  ?   Heart sounds: Normal heart sounds. No murmur heard. ?Pulmonary:  ?   Effort: Pulmonary effort is normal. No respiratory distress.  ?   Breath sounds: Normal breath sounds. No wheezing.  ?Abdominal:  ?   General: Bowel sounds are normal. There is no distension.  ?   Palpations: Abdomen is soft.  ?   Tenderness: There is no abdominal tenderness.  ?Musculoskeletal:     ?   General: Tenderness present.  ?   Cervical back: Normal range of motion and neck supple.  ?   Comments: Pain in left knee with flexion extension, unable to extend   ?Skin: ?   General: Skin is warm and dry.  ?Neurological:  ?   Mental Status: She is alert and oriented to person, place, and time.  ?   Cranial Nerves: No cranial nerve deficit.  ?   Deep Tendon Reflexes: Reflexes are normal and symmetric.  ?Psychiatric:     ?   Behavior: Behavior normal.     ?   Thought Content: Thought content normal.     ?   Judgment: Judgment normal.  ? ? ? ?BP 111/73   Pulse (!) 105   Temp (!) 97.2 ?F (36.2 ?C) (Temporal)   Ht 5' 3"  (1.6 m)   Wt 218 lb (98.9 kg)   BMI 38.62 kg/m?  ? ? ?   ?Assessment & Plan:  ?Christina [name] comes in today with chief complaint of Medical Management of Chronic Issues ? ? ?Diagnosis and orders addressed: ? ?1. Mild persistent asthma without complication ? ?- CMP14+EGFR ?- CBC with Differential/Platelet ?- cetirizine (ZYRTEC) 10 MG tablet; Take 1 tablet (10 mg total) by mouth daily.  Dispense: 90 tablet; Refill: 1 ?- montelukast (SINGULAIR) 10 MG tablet; Take 1 tablet (10 mg total) by mouth at bedtime.  Dispense: 90 tablet; Refill: 1 ? ?2. Gastroesophageal reflux disease without esophagitis ? ?- CMP14+EGFR ?- CBC with Differential/Platelet ?- omeprazole (PRILOSEC) 40 MG capsule; TAKE 1 CAPSULE DAILY  Dispense: 90 capsule; Refill: 1 ? ?3. Primary osteoarthritis involving multiple joints ? ?- CMP14+EGFR ?- CBC with Differential/Platelet ? ?4. OAB (overactive bladder) ? ?- CMP14+EGFR ?- CBC with  Differential/Platelet ? ?5. Constipation, unspecified constipation type ? ?- CMP14+EGFR ?- CBC with Differential/Platelet ? ?6. GAD (generalized anxiety disorder) ? ?- CMP14+EGFR ?- CBC with Differential/Platelet ?- busPIRone (BUSPAR) 5 MG tablet; Take 1 tablet (5 mg total) by mouth 2 (two) times daily.  Dispense: 90 tablet; Refill: 2 ? ?7. Morbid obesity ([place]) ?- CMP14+EGFR ?- CBC with Differential/Platelet ? ?8. Prediabetes ?- Bayer DCA Hb A1c Waived ?- CMP14+EGFR ?- CBC with Differential/Platelet ?- metFORMIN (GLUCOPHAGE-XR) 500 MG 24 hr tablet; Take 1 tablet (500 mg total) by mouth daily.  Dispense: 30 tablet; Refill: 0 ?- glucose blood test strip; Use as instructed  Dispense: 100 each; Refill: 12 ?- Accu-Chek Softclix Lancets lancets; Use as instructed  Dispense: 100 each; Refill: 12 ? ?9. Hyperlipidemia associated with type 2 diabetes mellitus (HCC) ?- atorvastatin (LIPITOR) 10 MG tablet; Take 1 tablet (10 mg total) by mouth daily.  Dispense: 90 tablet; Refill: 1 ? ? ? ?Labs pending ?Health  Maintenance reviewed ?Diet and exercise encouraged ? ?Follow up plan: ?3 months and keep follow up with Ortho ? ?Evelina Dun, FNP ? ? ? ?

## 2021-05-26 NOTE — Patient Instructions (Signed)

## 2021-05-27 LAB — CMP14+EGFR
ALT: 27 IU/L (ref 0–32)
AST: 22 IU/L (ref 0–40)
Albumin/Globulin Ratio: 2.1 (ref 1.2–2.2)
Albumin: 4.7 g/dL (ref 3.8–4.9)
Alkaline Phosphatase: 149 IU/L — ABNORMAL HIGH (ref 44–121)
BUN/Creatinine Ratio: 17 (ref 9–23)
BUN: 14 mg/dL (ref 6–24)
Bilirubin Total: 0.4 mg/dL (ref 0.0–1.2)
CO2: 24 mmol/L (ref 20–29)
Calcium: 9.8 mg/dL (ref 8.7–10.2)
Chloride: 104 mmol/L (ref 96–106)
Creatinine, Ser: 0.82 mg/dL (ref 0.57–1.00)
Globulin, Total: 2.2 g/dL (ref 1.5–4.5)
Glucose: 116 mg/dL — ABNORMAL HIGH (ref 70–99)
Potassium: 4.8 mmol/L (ref 3.5–5.2)
Sodium: 144 mmol/L (ref 134–144)
Total Protein: 6.9 g/dL (ref 6.0–8.5)
eGFR: 82 mL/min/{1.73_m2} (ref >59)

## 2021-05-27 LAB — CBC WITH DIFFERENTIAL/PLATELET
Basophils Absolute: 0 10*3/uL (ref 0.0–0.2)
Basos: 1 %
EOS (ABSOLUTE): 0.4 10*3/uL (ref 0.0–0.4)
Eos: 5 %
Hematocrit: 42.6 % (ref 34.0–46.6)
Hemoglobin: 14.7 g/dL (ref 11.1–15.9)
Immature Grans (Abs): 0 10*3/uL (ref 0.0–0.1)
Immature Granulocytes: 0 %
Lymphocytes Absolute: 2.2 10*3/uL (ref 0.7–3.1)
Lymphs: 28 %
MCH: 30.7 pg (ref 26.6–33.0)
MCHC: 34.5 g/dL (ref 31.5–35.7)
MCV: 89 fL (ref 79–97)
Monocytes Absolute: 0.5 10*3/uL (ref 0.1–0.9)
Monocytes: 6 %
Neutrophils Absolute: 4.7 10*3/uL (ref 1.4–7.0)
Neutrophils: 60 %
Platelets: 250 10*3/uL (ref 150–450)
RBC: 4.79 x10E6/uL (ref 3.77–5.28)
RDW: 13.2 % (ref 11.7–15.4)
WBC: 7.8 10*3/uL (ref 3.4–10.8)

## 2021-06-01 ENCOUNTER — Other Ambulatory Visit: Payer: Self-pay | Admitting: *Deleted

## 2021-06-01 DIAGNOSIS — E1169 Type 2 diabetes mellitus with other specified complication: Secondary | ICD-10-CM

## 2021-07-19 ENCOUNTER — Other Ambulatory Visit: Payer: Self-pay | Admitting: Family

## 2021-07-19 DIAGNOSIS — R7303 Prediabetes: Secondary | ICD-10-CM

## 2021-08-02 DIAGNOSIS — Z0289 Encounter for other administrative examinations: Secondary | ICD-10-CM

## 2021-08-05 ENCOUNTER — Ambulatory Visit (INDEPENDENT_AMBULATORY_CARE_PROVIDER_SITE_OTHER): Payer: 59 | Admitting: Family

## 2021-08-05 ENCOUNTER — Encounter: Payer: Self-pay | Admitting: Family

## 2021-08-05 VITALS — BP 119/71 | HR 94 | Temp 98.6°F | Ht 63.0 in | Wt 226.0 lb

## 2021-08-05 DIAGNOSIS — M545 Low back pain, unspecified: Secondary | ICD-10-CM | POA: Diagnosis not present

## 2021-08-05 DIAGNOSIS — M25562 Pain in left knee: Secondary | ICD-10-CM | POA: Diagnosis not present

## 2021-08-05 DIAGNOSIS — G8929 Other chronic pain: Secondary | ICD-10-CM

## 2021-08-05 DIAGNOSIS — R300 Dysuria: Secondary | ICD-10-CM

## 2021-08-05 LAB — URINALYSIS
Bilirubin, UA: NEGATIVE
Leukocytes,UA: NEGATIVE
Nitrite, UA: NEGATIVE
Protein,UA: NEGATIVE
RBC, UA: NEGATIVE
Specific Gravity, UA: 1.025 (ref 1.005–1.030)
Urobilinogen, Ur: 0.2 mg/dL (ref 0.2–1.0)
pH, UA: 5 (ref 5.0–7.5)

## 2021-08-05 MED ORDER — CELECOXIB 200 MG PO CAPS: 200.0000 mg | ORAL_CAPSULE | Freq: Two times a day (BID) | ORAL | 1 refills | Status: DC

## 2021-08-05 NOTE — Progress Notes (Signed)
Subjective:    Patient ID: Christina [name], female    DOB: [birth year], 61 y.o.   MRN: [id number]  Chief Complaint  Patient presents with   Back Pain    Spasms.   Leg Pain    LLE-    Urinary Tract Infection   PT presents to the office today with lumbar back pain that started the last three months. She is using a walker and thinks this has caused it.  Back Pain This is a new problem. The current episode started more than 1 month ago. The problem occurs intermittently. The problem has been waxing and waning since onset. The pain is present in the lumbar spine. The quality of the pain is described as aching. The pain is at a severity of 8/10. The pain is moderate. Associated symptoms include dysuria and leg pain. Pertinent negatives include no paresis, paresthesias or perianal numbness.  Leg Pain   Dysuria  This is a new problem. The current episode started in the past 7 days. The quality of the pain is described as aching and burning. The pain is at a severity of 5/10. The pain is mild. Associated symptoms include hesitancy and urgency. Pertinent negatives include no frequency, hematuria, nausea or vomiting. She has tried increased fluids for the symptoms. The treatment provided mild relief.      Review of Systems  Gastrointestinal:  Negative for nausea and vomiting.  Genitourinary:  Positive for dysuria, hesitancy and urgency. Negative for frequency and hematuria.  Musculoskeletal:  Positive for back pain.  Neurological:  Negative for paresthesias.  All other systems reviewed and are negative.      Objective:   Physical Exam Vitals reviewed.  Constitutional:      General: She is not in acute distress.    Appearance: She is well-developed.  HENT:     Head: Normocephalic and atraumatic.     Right Ear: Tympanic membrane normal.     Left Ear: Tympanic membrane normal.  Eyes:     Pupils: Pupils are equal, round, and reactive to light.  Neck:     Thyroid: No thyromegaly.   Cardiovascular:     Rate and Rhythm: Normal rate and regular rhythm.     Heart sounds: Normal heart sounds. No murmur heard. Pulmonary:     Effort: Pulmonary effort is normal. No respiratory distress.     Breath sounds: Normal breath sounds. No wheezing.  Abdominal:     General: Bowel sounds are normal. There is no distension.     Palpations: Abdomen is soft.     Tenderness: There is no abdominal tenderness.  Musculoskeletal:        General: Tenderness present.     Cervical back: Normal range of motion and neck supple.     Comments: Decreased ROM of left knee with flexion or extension  Skin:    General: Skin is warm and dry.  Neurological:     Mental Status: She is alert and oriented to person, place, and time.     Cranial Nerves: No cranial nerve deficit.     Deep Tendon Reflexes: Reflexes are normal and symmetric.  Psychiatric:        Behavior: Behavior normal.        Thought Content: Thought content normal.        Judgment: Judgment normal.     BP 119/71   Pulse 94   Temp 98.6 F (37 C)   Ht 5\' 3"  (1.6 m)   Wt 226 lb (102.5  kg)   SpO2 97%   BMI 40.03 kg/m      Assessment & Plan:  Christina [name] comes in today with chief complaint of Back Pain (Spasms.), Leg Pain (LLE- ), and Urinary Tract Infection   Diagnosis and orders addressed:  1. Dysuria Urine pending  - Urine Culture - Urinalysis  2. Morbid obesity (HCC)   3. Chronic pain of left knee Start Celebrex 200 mg BID with food No other NSAID's Keep Ortho follow up - celecoxib (CELEBREX) 200 MG capsule; Take 1 capsule (200 mg total) by mouth 2 (two) times daily.  Dispense: 60 capsule; Refill: 1  4. Acute bilateral low back pain without sciatica ROM exercises encouraged  Start Celebrex 200 mg BID  - celecoxib (CELEBREX) 200 MG capsule; Take 1 capsule (200 mg total) by mouth 2 (two) times daily.  Dispense: 60 capsule; Refill: 1    Health Maintenance reviewed Diet and exercise encouraged  Follow up  plan: Keep chronic follow up    Jannifer Rodney, FNP

## 2021-08-05 NOTE — Patient Instructions (Signed)

## 2021-08-06 ENCOUNTER — Telehealth: Payer: Self-pay

## 2021-08-06 ENCOUNTER — Telehealth: Payer: Self-pay | Admitting: Emergency Medicine

## 2021-08-06 ENCOUNTER — Telehealth: Payer: Self-pay | Admitting: Family

## 2021-08-06 DIAGNOSIS — M159 Polyosteoarthritis, unspecified: Secondary | ICD-10-CM

## 2021-08-06 LAB — URINE CULTURE

## 2021-08-06 MED ORDER — DICLOFENAC SODIUM 75 MG PO TBEC: 75.0000 mg | DELAYED_RELEASE_TABLET | Freq: Two times a day (BID) | ORAL | 0 refills | Status: DC

## 2021-08-06 NOTE — Telephone Encounter (Signed)
Patient is requesting an Antibiotic prior to dental procedure

## 2021-08-06 NOTE — Telephone Encounter (Signed)
Patient aware that paperwork is up front to pick up

## 2021-08-06 NOTE — Telephone Encounter (Signed)
Pt aware alternative was sent

## 2021-08-06 NOTE — Telephone Encounter (Signed)
Spoke to patient, she states that she discussed with Lendon Colonel at last office visit

## 2021-08-06 NOTE — Telephone Encounter (Signed)
Based on available data and consistent with expert guidelines from both the American Dental Association (ADA) and the American Academy of Orthopedic Surgeons (AAOS), the routine use of antibiotic prophylaxis in patients with prosthetic joints undergoing dental procedures is not advised. If her dentist feels that this is necessary they have the ability to prescribe it for her.

## 2021-08-06 NOTE — Telephone Encounter (Signed)
Patient states that husband brought in paper for her handicap placard. Please advise if the form is ready to pick up

## 2021-08-06 NOTE — Telephone Encounter (Signed)
Insurance will not cover celecoxib (CELEBREX) 200 MG capsule and patient needs something else called in. Please call back.

## 2021-08-06 NOTE — Telephone Encounter (Signed)
I don't see any indications on her chart for abx prophylaxis for dental procedures. Did her dentist request one?

## 2021-08-06 NOTE — Telephone Encounter (Signed)
Spoke to patient dentist recommends antibiotic prior to any dental procedures including cleanings for 2 years following surgery. Patient had knee replacement in Nov 2022

## 2021-08-09 ENCOUNTER — Telehealth: Payer: Self-pay | Admitting: Family

## 2021-08-09 NOTE — Telephone Encounter (Signed)
  Prescription Request  08/09/2021  Is this a "Controlled Substance" medicine? no 06/15 Have you seen your PCP in the last 2 weeks? 08/05/21  If YES, route message to pool  -  If NO, patient needs to be scheduled for appointment.  What is the name of the medication or equipment? Pt needs antibiotic before dentist will clean teeth  Have you contacted your pharmacy to request a refill? no   Which pharmacy would you like this sent to? [facility]   Patient notified that their request is being sent to the clinical staff for review and that they should receive a response within 2 business days.

## 2021-08-10 ENCOUNTER — Other Ambulatory Visit: Payer: Self-pay | Admitting: Family

## 2021-08-10 DIAGNOSIS — J4530 Mild persistent asthma, uncomplicated: Secondary | ICD-10-CM

## 2021-08-10 NOTE — Telephone Encounter (Signed)
The dentist should prescribe if she is appropriate for this.

## 2021-08-10 NOTE — Telephone Encounter (Signed)
Patient informed. 

## 2021-08-16 MED ORDER — AMOXICILLIN 500 MG PO CAPS: 2000.0000 mg | ORAL_CAPSULE | Freq: Once | ORAL | 1 refills | Status: AC

## 2021-08-17 ENCOUNTER — Other Ambulatory Visit: Payer: Self-pay | Admitting: Family

## 2021-08-17 DIAGNOSIS — R7303 Prediabetes: Secondary | ICD-10-CM

## 2021-08-18 ENCOUNTER — Other Ambulatory Visit: Payer: Self-pay | Admitting: Family Medicine

## 2021-08-18 DIAGNOSIS — M159 Polyosteoarthritis, unspecified: Secondary | ICD-10-CM

## 2021-09-08 ENCOUNTER — Other Ambulatory Visit: Payer: Self-pay | Admitting: Family

## 2021-09-08 DIAGNOSIS — J4530 Mild persistent asthma, uncomplicated: Secondary | ICD-10-CM

## 2021-09-13 ENCOUNTER — Other Ambulatory Visit: Payer: Self-pay | Admitting: Family

## 2021-09-13 DIAGNOSIS — R7303 Prediabetes: Secondary | ICD-10-CM

## 2021-09-24 ENCOUNTER — Other Ambulatory Visit: Payer: Self-pay | Admitting: Family

## 2021-09-24 DIAGNOSIS — R7303 Prediabetes: Secondary | ICD-10-CM

## 2021-09-24 DIAGNOSIS — K219 Gastro-esophageal reflux disease without esophagitis: Secondary | ICD-10-CM

## 2021-11-02 ENCOUNTER — Other Ambulatory Visit: Payer: Self-pay | Admitting: Family

## 2021-11-02 DIAGNOSIS — R7303 Prediabetes: Secondary | ICD-10-CM

## 2021-11-24 ENCOUNTER — Other Ambulatory Visit: Payer: Self-pay | Admitting: Family

## 2021-11-24 DIAGNOSIS — K219 Gastro-esophageal reflux disease without esophagitis: Secondary | ICD-10-CM

## 2022-02-16 ENCOUNTER — Other Ambulatory Visit: Payer: Self-pay | Admitting: Family

## 2022-02-16 DIAGNOSIS — R7303 Prediabetes: Secondary | ICD-10-CM

## 2022-02-18 ENCOUNTER — Other Ambulatory Visit: Payer: Self-pay | Admitting: Family

## 2022-02-18 DIAGNOSIS — E1169 Type 2 diabetes mellitus with other specified complication: Secondary | ICD-10-CM

## 2022-02-19 ENCOUNTER — Other Ambulatory Visit: Payer: Self-pay | Admitting: Family

## 2022-04-21 ENCOUNTER — Other Ambulatory Visit: Payer: Self-pay | Admitting: Family

## 2022-04-21 ENCOUNTER — Other Ambulatory Visit: Payer: Self-pay | Admitting: *Deleted

## 2022-04-21 DIAGNOSIS — J4530 Mild persistent asthma, uncomplicated: Secondary | ICD-10-CM

## 2022-04-21 DIAGNOSIS — F411 Generalized anxiety disorder: Secondary | ICD-10-CM

## 2022-04-21 DIAGNOSIS — M159 Polyosteoarthritis, unspecified: Secondary | ICD-10-CM

## 2022-05-01 ENCOUNTER — Other Ambulatory Visit: Payer: Self-pay | Admitting: Family

## 2022-05-01 DIAGNOSIS — R7303 Prediabetes: Secondary | ICD-10-CM

## 2022-05-04 ENCOUNTER — Other Ambulatory Visit: Payer: Self-pay | Admitting: Family

## 2022-05-04 DIAGNOSIS — M159 Polyosteoarthritis, unspecified: Secondary | ICD-10-CM

## 2022-05-17 ENCOUNTER — Other Ambulatory Visit: Payer: Self-pay | Admitting: Family

## 2022-05-17 DIAGNOSIS — J4530 Mild persistent asthma, uncomplicated: Secondary | ICD-10-CM

## 2022-05-17 DIAGNOSIS — M159 Polyosteoarthritis, unspecified: Secondary | ICD-10-CM

## 2022-05-29 ENCOUNTER — Other Ambulatory Visit: Payer: Self-pay | Admitting: Family

## 2022-05-29 DIAGNOSIS — J4530 Mild persistent asthma, uncomplicated: Secondary | ICD-10-CM

## 2022-06-07 ENCOUNTER — Other Ambulatory Visit: Payer: Self-pay | Admitting: Family

## 2022-06-07 DIAGNOSIS — J4530 Mild persistent asthma, uncomplicated: Secondary | ICD-10-CM

## 2022-06-13 ENCOUNTER — Other Ambulatory Visit: Payer: Self-pay | Admitting: Family

## 2022-06-13 DIAGNOSIS — J4530 Mild persistent asthma, uncomplicated: Secondary | ICD-10-CM

## 2022-07-02 ENCOUNTER — Other Ambulatory Visit: Payer: Self-pay | Admitting: Family

## 2022-07-02 DIAGNOSIS — M159 Polyosteoarthritis, unspecified: Secondary | ICD-10-CM

## 2022-12-11 ENCOUNTER — Other Ambulatory Visit: Payer: Self-pay | Admitting: Family

## 2022-12-11 DIAGNOSIS — J4530 Mild persistent asthma, uncomplicated: Secondary | ICD-10-CM

## 2023-01-02 ENCOUNTER — Telehealth: Payer: Self-pay | Admitting: Family Medicine

## 2023-01-02 NOTE — Telephone Encounter (Signed)
Explained that patient has not been seen in over a year and must have appointment.  Appointment scheduled on 01/17/23 with Jannifer Rodney.

## 2023-01-02 NOTE — Telephone Encounter (Signed)
Copied from CRM [id number]. Topic: General - Other >> Jan 02, 2023  8:16 AM Prudencio Pair wrote: Reason for CRM: Patient's niece, Caroleen Hamman, called stating that her aunt is requesting for forms to be completed so that she can receive a Handicap car tag. Patient is asking that Dr. Jannifer Rodney complete the paperwork. Pt's niece, Amy, stated to give her a call. CB # [phone].

## 2023-01-13 ENCOUNTER — Telehealth: Payer: 59 | Admitting: Family

## 2023-01-17 ENCOUNTER — Ambulatory Visit: Payer: 59 | Admitting: Family

## 2023-01-18 ENCOUNTER — Ambulatory Visit: Payer: 59 | Admitting: Family Medicine

## 2023-05-06 ENCOUNTER — Other Ambulatory Visit: Payer: Self-pay | Admitting: Family

## 2023-05-06 DIAGNOSIS — J4530 Mild persistent asthma, uncomplicated: Secondary | ICD-10-CM

## 2023-05-08 NOTE — Telephone Encounter (Signed)
 Spoke with pt she is now living in [place]

## 2023-05-08 NOTE — Telephone Encounter (Signed)
 Refills denied, pt hasn't been seen in office since 2023. Needs appt with PCP for refills.

## 2023-07-10 DIAGNOSIS — G43909 Migraine, unspecified, not intractable, without status migrainosus: Secondary | ICD-10-CM | POA: Diagnosis not present

## 2023-07-10 DIAGNOSIS — R29898 Other symptoms and signs involving the musculoskeletal system: Secondary | ICD-10-CM | POA: Diagnosis not present

## 2023-07-25 ENCOUNTER — Other Ambulatory Visit: Payer: Self-pay | Admitting: Family

## 2023-07-25 DIAGNOSIS — J4530 Mild persistent asthma, uncomplicated: Secondary | ICD-10-CM

## 2023-07-26 NOTE — Telephone Encounter (Signed)
 Left message for patient to call back and make an appt if she did need this medication refilled.

## 2023-07-26 NOTE — Telephone Encounter (Signed)
 Christy NTBS last OV 08/05/21 NO RF sent to pharmacy last OV greater than a year

## 2023-07-30 DIAGNOSIS — K449 Diaphragmatic hernia without obstruction or gangrene: Secondary | ICD-10-CM | POA: Diagnosis not present

## 2023-07-30 DIAGNOSIS — G43909 Migraine, unspecified, not intractable, without status migrainosus: Secondary | ICD-10-CM | POA: Diagnosis not present

## 2023-07-30 DIAGNOSIS — K760 Fatty (change of) liver, not elsewhere classified: Secondary | ICD-10-CM | POA: Diagnosis not present

## 2023-07-30 DIAGNOSIS — G4733 Obstructive sleep apnea (adult) (pediatric): Secondary | ICD-10-CM | POA: Diagnosis not present

## 2023-07-30 DIAGNOSIS — R0600 Dyspnea, unspecified: Secondary | ICD-10-CM | POA: Diagnosis not present

## 2023-07-30 DIAGNOSIS — E669 Obesity, unspecified: Secondary | ICD-10-CM | POA: Diagnosis not present

## 2023-07-30 DIAGNOSIS — I209 Angina pectoris, unspecified: Secondary | ICD-10-CM | POA: Diagnosis not present

## 2023-07-30 DIAGNOSIS — R0789 Other chest pain: Secondary | ICD-10-CM | POA: Diagnosis not present

## 2023-07-30 DIAGNOSIS — E119 Type 2 diabetes mellitus without complications: Secondary | ICD-10-CM | POA: Diagnosis not present

## 2023-07-30 DIAGNOSIS — R0602 Shortness of breath: Secondary | ICD-10-CM | POA: Diagnosis not present

## 2023-07-30 DIAGNOSIS — Z96652 Presence of left artificial knee joint: Secondary | ICD-10-CM | POA: Diagnosis not present

## 2023-07-31 DIAGNOSIS — Z9989 Dependence on other enabling machines and devices: Secondary | ICD-10-CM | POA: Diagnosis not present

## 2023-07-31 DIAGNOSIS — Z96652 Presence of left artificial knee joint: Secondary | ICD-10-CM | POA: Diagnosis not present

## 2023-07-31 DIAGNOSIS — K760 Fatty (change of) liver, not elsewhere classified: Secondary | ICD-10-CM | POA: Diagnosis not present

## 2023-07-31 DIAGNOSIS — R0600 Dyspnea, unspecified: Secondary | ICD-10-CM | POA: Diagnosis not present

## 2023-07-31 DIAGNOSIS — E669 Obesity, unspecified: Secondary | ICD-10-CM | POA: Diagnosis not present

## 2023-07-31 DIAGNOSIS — K449 Diaphragmatic hernia without obstruction or gangrene: Secondary | ICD-10-CM | POA: Diagnosis not present

## 2023-07-31 DIAGNOSIS — G43909 Migraine, unspecified, not intractable, without status migrainosus: Secondary | ICD-10-CM | POA: Diagnosis not present

## 2023-07-31 DIAGNOSIS — E119 Type 2 diabetes mellitus without complications: Secondary | ICD-10-CM | POA: Diagnosis not present

## 2023-07-31 DIAGNOSIS — G4733 Obstructive sleep apnea (adult) (pediatric): Secondary | ICD-10-CM | POA: Diagnosis not present

## 2023-08-01 DIAGNOSIS — K449 Diaphragmatic hernia without obstruction or gangrene: Secondary | ICD-10-CM | POA: Diagnosis not present

## 2023-08-01 DIAGNOSIS — R0602 Shortness of breath: Secondary | ICD-10-CM | POA: Diagnosis not present

## 2023-08-05 DIAGNOSIS — R0789 Other chest pain: Secondary | ICD-10-CM | POA: Diagnosis not present

## 2023-08-05 DIAGNOSIS — G8929 Other chronic pain: Secondary | ICD-10-CM | POA: Diagnosis not present

## 2023-08-05 DIAGNOSIS — K449 Diaphragmatic hernia without obstruction or gangrene: Secondary | ICD-10-CM | POA: Diagnosis not present

## 2023-08-08 DIAGNOSIS — G4733 Obstructive sleep apnea (adult) (pediatric): Secondary | ICD-10-CM | POA: Diagnosis not present

## 2023-08-08 DIAGNOSIS — R0602 Shortness of breath: Secondary | ICD-10-CM | POA: Diagnosis not present

## 2023-08-08 DIAGNOSIS — E669 Obesity, unspecified: Secondary | ICD-10-CM | POA: Diagnosis not present

## 2023-08-14 DIAGNOSIS — R0609 Other forms of dyspnea: Secondary | ICD-10-CM | POA: Diagnosis not present

## 2023-08-21 DIAGNOSIS — E669 Obesity, unspecified: Secondary | ICD-10-CM | POA: Diagnosis not present

## 2023-08-21 DIAGNOSIS — Z809 Family history of malignant neoplasm, unspecified: Secondary | ICD-10-CM | POA: Diagnosis not present

## 2023-08-21 DIAGNOSIS — F419 Anxiety disorder, unspecified: Secondary | ICD-10-CM | POA: Diagnosis not present

## 2023-08-21 DIAGNOSIS — K5900 Constipation, unspecified: Secondary | ICD-10-CM | POA: Diagnosis not present

## 2023-08-21 DIAGNOSIS — K219 Gastro-esophageal reflux disease without esophagitis: Secondary | ICD-10-CM | POA: Diagnosis not present

## 2023-08-21 DIAGNOSIS — I10 Essential (primary) hypertension: Secondary | ICD-10-CM | POA: Diagnosis not present

## 2023-08-21 DIAGNOSIS — Z8249 Family history of ischemic heart disease and other diseases of the circulatory system: Secondary | ICD-10-CM | POA: Diagnosis not present

## 2023-08-21 DIAGNOSIS — M1990 Unspecified osteoarthritis, unspecified site: Secondary | ICD-10-CM | POA: Diagnosis not present

## 2023-08-21 DIAGNOSIS — Z833 Family history of diabetes mellitus: Secondary | ICD-10-CM | POA: Diagnosis not present

## 2023-08-21 DIAGNOSIS — E1142 Type 2 diabetes mellitus with diabetic polyneuropathy: Secondary | ICD-10-CM | POA: Diagnosis not present

## 2023-08-21 DIAGNOSIS — E785 Hyperlipidemia, unspecified: Secondary | ICD-10-CM | POA: Diagnosis not present

## 2023-08-21 DIAGNOSIS — R32 Unspecified urinary incontinence: Secondary | ICD-10-CM | POA: Diagnosis not present

## 2023-10-06 DIAGNOSIS — G4733 Obstructive sleep apnea (adult) (pediatric): Secondary | ICD-10-CM | POA: Diagnosis not present

## 2023-12-07 DIAGNOSIS — G4733 Obstructive sleep apnea (adult) (pediatric): Secondary | ICD-10-CM | POA: Diagnosis not present

## 2024-01-08 DIAGNOSIS — G4733 Obstructive sleep apnea (adult) (pediatric): Secondary | ICD-10-CM | POA: Diagnosis not present

## 2024-01-08 DIAGNOSIS — R2689 Other abnormalities of gait and mobility: Secondary | ICD-10-CM | POA: Diagnosis not present
